# Patient Record
Sex: Male | Born: 1965 | Race: Black or African American | Hispanic: No | Marital: Single | Smoking: Never smoker
Health system: Southern US, Community
[De-identification: ages and names within clinical notes are randomized; demographics above are authoritative.]

## PROBLEM LIST (undated history)

## (undated) DIAGNOSIS — G5600 Carpal tunnel syndrome, unspecified upper limb: Secondary | ICD-10-CM

## (undated) DIAGNOSIS — I00 Rheumatic fever without heart involvement: Secondary | ICD-10-CM

## (undated) HISTORY — PX: EYE SURGERY: SHX253

## (undated) HISTORY — PX: CARPAL TUNNEL RELEASE: SHX101

## (undated) HISTORY — DX: Carpal tunnel syndrome, unspecified upper limb: G56.00

---

## 2010-02-12 ENCOUNTER — Emergency Department (HOSPITAL_BASED_OUTPATIENT_CLINIC_OR_DEPARTMENT_OTHER): Admission: EM | Admit: 2010-02-12 | Discharge: 2010-02-13 | Payer: Self-pay | Admitting: Emergency Medicine

## 2010-06-09 ENCOUNTER — Emergency Department (HOSPITAL_BASED_OUTPATIENT_CLINIC_OR_DEPARTMENT_OTHER): Admission: EM | Admit: 2010-06-09 | Discharge: 2010-06-09 | Payer: Self-pay | Admitting: Emergency Medicine

## 2010-06-09 ENCOUNTER — Ambulatory Visit: Payer: Self-pay | Admitting: Diagnostic Radiology

## 2010-06-30 ENCOUNTER — Emergency Department (HOSPITAL_BASED_OUTPATIENT_CLINIC_OR_DEPARTMENT_OTHER): Admission: EM | Admit: 2010-06-30 | Discharge: 2010-07-01 | Payer: Self-pay | Admitting: Emergency Medicine

## 2010-07-01 ENCOUNTER — Ambulatory Visit: Payer: Self-pay | Admitting: Diagnostic Radiology

## 2010-09-20 ENCOUNTER — Ambulatory Visit: Payer: Self-pay | Admitting: Diagnostic Radiology

## 2010-09-20 ENCOUNTER — Emergency Department (HOSPITAL_BASED_OUTPATIENT_CLINIC_OR_DEPARTMENT_OTHER)
Admission: EM | Admit: 2010-09-20 | Discharge: 2010-09-20 | Payer: Self-pay | Source: Home / Self Care | Admitting: Emergency Medicine

## 2011-02-15 LAB — GLUCOSE, CAPILLARY: Glucose-Capillary: 96 mg/dL (ref 70–99)

## 2011-02-27 LAB — HEMOCCULT GUIAC POC 1CARD (OFFICE): Fecal Occult Bld: POSITIVE
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
## 2014-08-30 ENCOUNTER — Emergency Department (HOSPITAL_BASED_OUTPATIENT_CLINIC_OR_DEPARTMENT_OTHER)
Admission: EM | Admit: 2014-08-30 | Discharge: 2014-08-30 | Disposition: A | Discharge status: Home / Self Care | Payer: BC Managed Care – PPO | Attending: Emergency Medicine | Admitting: Emergency Medicine

## 2014-08-30 ENCOUNTER — Emergency Department (HOSPITAL_BASED_OUTPATIENT_CLINIC_OR_DEPARTMENT_OTHER): Payer: BC Managed Care – PPO

## 2014-08-30 ENCOUNTER — Encounter (HOSPITAL_BASED_OUTPATIENT_CLINIC_OR_DEPARTMENT_OTHER): Payer: Self-pay | Admitting: Emergency Medicine

## 2014-08-30 DIAGNOSIS — M545 Low back pain, unspecified: Secondary | ICD-10-CM

## 2014-08-30 DIAGNOSIS — Y9389 Activity, other specified: Secondary | ICD-10-CM | POA: Insufficient documentation

## 2014-08-30 DIAGNOSIS — Y92410 Unspecified street and highway as the place of occurrence of the external cause: Secondary | ICD-10-CM | POA: Diagnosis not present

## 2014-08-30 DIAGNOSIS — IMO0002 Reserved for concepts with insufficient information to code with codable children: Secondary | ICD-10-CM | POA: Insufficient documentation

## 2014-08-30 MED ORDER — HYDROCODONE-ACETAMINOPHEN 5-325 MG PO TABS: 2.0000 | ORAL_TABLET | ORAL | Status: DC | PRN

## 2014-08-30 MED ORDER — CYCLOBENZAPRINE HCL 10 MG PO TABS: 10.0000 mg | ORAL_TABLET | Freq: Two times a day (BID) | ORAL | Status: DC | PRN

## 2014-08-30 NOTE — Discharge Instructions (Signed)
Take Vicodin and Flexeril as needed for pain. You may take these medications together. Refer to attached documents for more information.

## 2014-08-30 NOTE — ED Provider Notes (Signed)
Medical screening examination/treatment/procedure(s) were performed by non-physician practitioner and as supervising physician I was immediately available for consultation/collaboration.   EKG Interpretation None        Shadaya Marschner, MD 08/30/14 1543 
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
## 2016-02-14 ENCOUNTER — Emergency Department (HOSPITAL_BASED_OUTPATIENT_CLINIC_OR_DEPARTMENT_OTHER): Payer: No Typology Code available for payment source

## 2016-02-14 ENCOUNTER — Emergency Department (HOSPITAL_BASED_OUTPATIENT_CLINIC_OR_DEPARTMENT_OTHER)
Admission: EM | Admit: 2016-02-14 | Discharge: 2016-02-14 | Disposition: A | Discharge status: Home / Self Care | Payer: No Typology Code available for payment source | Attending: Emergency Medicine | Admitting: Emergency Medicine

## 2016-02-14 ENCOUNTER — Encounter (HOSPITAL_BASED_OUTPATIENT_CLINIC_OR_DEPARTMENT_OTHER): Payer: Self-pay | Admitting: *Deleted

## 2016-02-14 DIAGNOSIS — M79604 Pain in right leg: Secondary | ICD-10-CM | POA: Insufficient documentation

## 2016-02-14 DIAGNOSIS — M79605 Pain in left leg: Secondary | ICD-10-CM | POA: Insufficient documentation

## 2016-02-14 DIAGNOSIS — R252 Cramp and spasm: Secondary | ICD-10-CM | POA: Insufficient documentation

## 2016-02-14 DIAGNOSIS — M545 Low back pain: Secondary | ICD-10-CM | POA: Insufficient documentation

## 2016-02-14 DIAGNOSIS — M79672 Pain in left foot: Secondary | ICD-10-CM | POA: Insufficient documentation

## 2016-02-14 LAB — CBC WITH DIFFERENTIAL/PLATELET
BASOS ABS: 0 10*3/uL (ref 0.0–0.1)
BASOS PCT: 0 %
EOS PCT: 2 %
Eosinophils Absolute: 0.1 10*3/uL (ref 0.0–0.7)
HEMATOCRIT: 38.2 % — AB (ref 39.0–52.0)
Hemoglobin: 13.2 g/dL (ref 13.0–17.0)
Lymphocytes Relative: 34 %
Lymphs Abs: 2.1 10*3/uL (ref 0.7–4.0)
MCH: 28.4 pg (ref 26.0–34.0)
MCHC: 34.6 g/dL (ref 30.0–36.0)
MCV: 82.3 fL (ref 78.0–100.0)
MONO ABS: 0.7 10*3/uL (ref 0.1–1.0)
MONOS PCT: 11 %
NEUTROS ABS: 3.3 10*3/uL (ref 1.7–7.7)
Neutrophils Relative %: 53 %
PLATELETS: 232 10*3/uL (ref 150–400)
RBC: 4.64 MIL/uL (ref 4.22–5.81)
RDW: 12.1 % (ref 11.5–15.5)
WBC: 6.1 10*3/uL (ref 4.0–10.5)

## 2016-02-14 LAB — BASIC METABOLIC PANEL
ANION GAP: 6 (ref 5–15)
BUN: 18 mg/dL (ref 6–20)
CALCIUM: 9 mg/dL (ref 8.9–10.3)
CO2: 25 mmol/L (ref 22–32)
CREATININE: 0.99 mg/dL (ref 0.61–1.24)
Chloride: 111 mmol/L (ref 101–111)
GFR calc Af Amer: >60 mL/min (ref >60)
GLUCOSE: 99 mg/dL (ref 65–99)
Potassium: 4.3 mmol/L (ref 3.5–5.1)
Sodium: 142 mmol/L (ref 135–145)

## 2016-02-14 MED ORDER — PREDNISONE 10 MG PO TABS: 20.0000 mg | ORAL_TABLET | Freq: Two times a day (BID) | ORAL | Status: DC

## 2016-02-14 MED ORDER — HYDROCODONE-ACETAMINOPHEN 5-325 MG PO TABS: 1.0000 | ORAL_TABLET | Freq: Four times a day (QID) | ORAL | Status: DC | PRN

## 2016-02-14 NOTE — ED Notes (Signed)
Reports 4-5 months of bilateral leg pain. Family history of CMT

## 2016-02-14 NOTE — ED Provider Notes (Signed)
CSN: [id number]     Arrival date & time 02/14/16  4098 History   First MD Initiated Contact with Patient 02/14/16 1013     Chief Complaint  Patient presents with  . Leg Pain     (Consider location/radiation/quality/duration/timing/severity/associated sxs/prior Treatment) HPI Comments: Patient is a 50 year old male with no significant past medical history. He presents for evaluation of bilateral leg pain. He reports pain in his left heel in the absence of any injury or trauma. He also reports having cramping in his right thigh that occurs at night. This is been ongoing for many months. He also reports that when he wakes in the morning his having difficulty with his legs holding him up. This seems to work it way out throughout the day. He reports having not missed a day of work due to this. He is concerned because his family has a history of Charcot-Marie-Tooth disease.  Patient is a 50 y.o. male presenting with leg pain. The history is provided by the patient.  Leg Pain Location:  Leg Leg location:  R leg and L leg Pain details:    Quality:  Cramping   Radiates to:  Does not radiate   Severity:  Moderate   Onset quality:  Gradual   Duration:  5 months   Timing:  Constant   Progression:  Worsening Chronicity:  New   History reviewed. No pertinent past medical history. Past Surgical History  Procedure Laterality Date  . Eye surgery     No family history on file. Social History  Substance Use Topics  . Smoking status: Never Smoker   . Smokeless tobacco: None  . Alcohol Use: Yes     Comment: occasional    Review of Systems  All other systems reviewed and are negative.     Allergies  Review of patient's allergies indicates no known allergies.  Home Medications   Prior to Admission medications   Medication Sig Start Date End Date Taking? Authorizing Provider  cyclobenzaprine (FLEXERIL) 10 MG tablet Take 1 tablet (10 mg total) by mouth 2 (two) times daily as needed for  muscle spasms. 08/30/14   Emilia Beck, PA-C  HYDROcodone-acetaminophen (NORCO/VICODIN) 5-325 MG per tablet Take 2 tablets by mouth every 4 (four) hours as needed for moderate pain or severe pain. 08/30/14   Kaitlyn Szekalski, PA-C   BP 129/79 mmHg  Pulse 64  Temp(Src) 97.7 F (36.5 C) (Oral)  Resp 18  Ht  (1.753 m)  Wt 190 lb (86.183 kg)  BMI 28.05 kg/m2  SpO2 99% Physical Exam  Constitutional: He is oriented to person, place, and time. He appears well-developed and well-nourished. No distress.  HENT:  Head: Normocephalic and atraumatic.  Neck: Normal range of motion. Neck supple.  Cardiovascular: Normal rate, regular rhythm and normal heart sounds.   No murmur heard. Pulmonary/Chest: Effort normal and breath sounds normal. No respiratory distress.  Musculoskeletal:  There is mild tenderness in the soft tissues of the lumbar region. The right thigh appears grossly normal. The left heel appears grossly normal, however there is tenderness over the bottom of the foot when palpated.  Neurological: He is alert and oriented to person, place, and time.  DTRs are 1+ and symmetrical in the patellar Achilles tendons. Strength is 5 out of 5 in the bilateral lower extremities.  Skin: Skin is warm and dry. He is not diaphoretic.  Nursing note and vitals reviewed.   ED Course  Procedures (including critical care time) Labs Review Labs Reviewed -  No data to display  Imaging Review No results found. I have personally reviewed and evaluated these images and lab results as part of my medical decision-making.    MDM   Final diagnoses:  None    X-rays are negative and blood work is unremarkable. I am uncertain as to what is causing his ongoing leg pain, however nothing today appears emergent. His reflexes and strength are symmetrical. 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 This is a possibility, however he was educated that this  is not an emergency department diagnosis and that he needs to follow-up with his primary Dr. for possible referral to a neurologist.    Geoffery Lyonsouglas Tiasia Weberg, MD 02/15/16 [phone]

## 2016-02-14 NOTE — ED Notes (Signed)
Pt c/o rt thigh pain and left ankle pain x 6 months off and on  States pain is worse in am and when walking,  States has been seen and treated for left ankle

## 2016-02-14 NOTE — Discharge Instructions (Signed)
Prednisone as prescribed.  Hydrocodone as prescribed as needed for pain.  You should follow-up with a neurologist in the near future to discuss your issues.   Musculoskeletal Pain Musculoskeletal pain is muscle and boney aches and pains. These pains can occur in any part of the body. Your caregiver may treat you without knowing the cause of the pain. They may treat you if blood or urine tests, X-rays, and other tests were normal.  CAUSES There is often not a definite cause or reason for these pains. These pains may be caused by a type of germ (virus). The discomfort may also come from overuse. Overuse includes working out too hard when your body is not fit. Boney aches also come from weather changes. Bone is sensitive to atmospheric pressure changes. HOME CARE INSTRUCTIONS   Ask when your test results will be ready. Make sure you get your test results.  Only take over-the-counter or prescription medicines for pain, discomfort, or fever as directed by your caregiver. If you were given medications for your condition, do not drive, operate machinery or power tools, or sign legal documents for 24 hours. Do not drink alcohol. Do not take sleeping pills or other medications that may interfere with treatment.  Continue all activities unless the activities cause more pain. When the pain lessens, slowly resume normal activities. Gradually increase the intensity and duration of the activities or exercise.  During periods of severe pain, bed rest may be helpful. Lay or sit in any position that is comfortable.  Putting ice on the injured area.  Put ice in a bag.  Place a towel between your skin and the bag.  Leave the ice on for 15 to 20 minutes, 3 to 4 times a day.  Follow up with your caregiver for continued problems and no reason can be found for the pain. If the pain becomes worse or does not go away, it may be necessary to repeat tests or do additional testing. Your caregiver may need to look  further for a possible cause. SEEK IMMEDIATE MEDICAL CARE IF:  You have pain that is getting worse and is not relieved by medications.  You develop chest pain that is associated with shortness or breath, sweating, feeling sick to your stomach (nauseous), or throw up (vomit).  Your pain becomes localized to the abdomen.  You develop any new symptoms that seem different or that concern you. MAKE SURE YOU:   Understand these instructions.  Will watch your condition.  Will get help right away if you are not doing well or get worse.   This information is not intended to replace advice given to you by your health care provider. Make sure you discuss any questions you have with your health care provider.   Document Released: 11/20/2005 Document Revised: 02/12/2012 Document Reviewed: 07/25/2013 Elsevier Interactive Patient Education [employer]2016 Elsevier Inc.

## 2016-02-15 ENCOUNTER — Ambulatory Visit (INDEPENDENT_AMBULATORY_CARE_PROVIDER_SITE_OTHER): Payer: BLUE CROSS/BLUE SHIELD | Admitting: Neurology

## 2016-02-15 ENCOUNTER — Encounter: Payer: Self-pay | Admitting: Neurology

## 2016-02-15 VITALS — BP 118/72 | HR 70 | Resp 18 | Ht 69.0 in | Wt 192.0 lb

## 2016-02-15 DIAGNOSIS — M79605 Pain in left leg: Secondary | ICD-10-CM

## 2016-02-15 DIAGNOSIS — Z820 Family history of epilepsy and other diseases of the nervous system: Secondary | ICD-10-CM | POA: Diagnosis not present

## 2016-02-15 DIAGNOSIS — G609 Hereditary and idiopathic neuropathy, unspecified: Secondary | ICD-10-CM

## 2016-02-15 DIAGNOSIS — M79604 Pain in right leg: Secondary | ICD-10-CM | POA: Diagnosis not present

## 2016-02-15 MED ORDER — GABAPENTIN 100 MG PO CAPS
ORAL_CAPSULE | ORAL | Status: DC
Start: 2016-02-15 — End: 2017-12-06

## 2016-02-15 NOTE — Progress Notes (Signed)
Subjective:    Patient ID: Travis Odom is a 50 y.o. male.  HPI     Travis Foley, MD, PhD [facility] [address], [address] [address] [place], [place] [address]  Travis Odom is a 50 year old right-handed gentleman who is referred by the emergency room for bilateral leg pain. The patient is unaccompanied today. He presented to the emergency room on 02/14/2016 with a several month history of bilateral leg cramps and pain particularly at night. He denied any trauma or injury. I reviewed the emergency room records from 02/14/2016. He had a left foot x-ray and 3 views on 02/14/2016 which was reported as negative. He was given a prescription for prednisone, Norco, and Flexeril yesterday but did not fill the medications. He reports leg pain in the below knee areas for the past 1 year, worse in the past 6 months.  He has a family history of Charcot-Marie-Tooth, affecting his mother and all 4 siblings.  He has no PCP. He gets fatigued, and weaker in the legs. Has had leg cramps in the calves and thighs. 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  His work does involve a lot of physical work. He feels that he is struggling to maintain his level of work. He does not smoke cigarettes or use illicit drugs. He drinks alcohol rarely. He tries to hydrate well. He has been soaking his feet and legs in warm water with Epsom salt and this helps reduce his pain. He describes tingling as well in his feet and this is off and on, some numbness. A year ago he injured his left heel. He also has a remote injury to his left calf from playing football. He referrees high school basketball.  His Past Medical History Is Significant For: No past medical history on file.  His Past Surgical History Is Significant For: Past Surgical History  Procedure Laterality Date  . Eye surgery      His Family History Is Significant For: Family History   Problem Relation Age of Onset  . Charcot-Marie-Tooth disease Mother   . Charcot-Marie-Tooth disease Sister   . Charcot-Marie-Tooth disease Brother   . Charcot-Marie-Tooth disease Sister     His Social History Is Significant For: Social History   Social History  . Marital Status: Single    Spouse Name: N/A  . Number of Children: N/A  . Years of Education: 12   Occupational History  . [employer]    Social History Main Topics  . Smoking status: Never Smoker   . Smokeless tobacco: None  . Alcohol Use: Yes     Comment: occasional  . Drug Use: No  . Sexual Activity: Not Asked   Other Topics Concern  . None   Social History Narrative   Denies caffeine use     His Allergies Are:  No Known Allergies:   His Current Medications Are:  Outpatient Encounter Prescriptions as of 02/15/2016  Medication Sig  . cyclobenzaprine (FLEXERIL) 10 MG tablet Take 1 tablet (10 mg total) by mouth 2 (two) times daily as needed for muscle spasms. (Patient not taking: Reported on 02/15/2016)  . gabapentin (NEURONTIN) 100 MG capsule Take 1 pill nightly at bedtime for 1 week, then 2 pills nightly for 1 week, then 3 pills each night thereafter.  Marland Kitchen HYDROcodone-acetaminophen (NORCO) 5-325 MG tablet Take 1-2 tablets by mouth every 6 (six) hours as needed. (Patient not taking: Reported on 02/15/2016)  .  predniSONE (DELTASONE) 10 MG tablet Take 2 tablets (20 mg total) by mouth 2 (two) times daily. (Patient not taking: Reported on 02/15/2016)   No facility-administered encounter medications on file as of 02/15/2016.  :   Review of Systems:  Out of a complete 14 point review of systems, all are reviewed and negative with the exception of these symptoms as listed below:   Review of Systems  Neurological:       Patient reports that he has pain in both legs and feet. States it is so bad in the morning that he has to crawl out of bed and get in warm water.  Charcot-Marie-Tooth Disease runs in his family (mother  and all siblings have it).     Objective:  Neurologic Exam  Physical Exam Physical Examination:   Filed Vitals:   02/15/16 1440  BP: 118/72  Pulse: 70  Resp: 18   General Examination: The patient is a very pleasant 50 y.o. male in no acute distress. He appears well-developed and well-nourished and marginally groomed. He is mildly anxious appearing.   HEENT: Normocephalic, atraumatic, pupils are equal, round and reactive to light and accommodation. Funduscopic exam is normal with sharp disc margins noted. Extraocular tracking is good without limitation to gaze excursion or nystagmus noted. Normal smooth pursuit is noted. Hearing is grossly intact. Face is symmetric with normal facial animation and normal facial sensation. Speech is clear with no dysarthria noted. There is no hypophonia. There is no lip, neck/head, jaw or voice tremor. Neck is supple with full range of passive and active motion. There are no carotid bruits on auscultation. Oropharynx exam reveals: mild mouth dryness, adequate dental hygiene and moderate airway crowding, due to thicker soft palate and tonsils in place. Mallampati is class II. Tongue protrudes centrally and palate elevates symmetrically.    Chest: Clear to auscultation without wheezing, rhonchi or crackles noted.  Heart: S1+S2+0, regular and normal without murmurs, rubs or gallops noted.   Abdomen: Soft, non-tender and non-distended with normal bowel sounds appreciated on auscultation.  Extremities: There is no pitting edema in the distal lower extremities bilaterally. Pedal pulses are intact.  Skin: Warm and dry without trophic changes noted. There are no varicose veins.  Musculoskeletal: exam reveals no obvious joint deformities, tenderness or joint swelling or erythema, mildly slender in the distal legs.   Neurologically:  Mental status: The patient is awake, alert and oriented in all 4 spheres. Her immediate and remote memory, attention, language  skills and fund of knowledge are appropriate. There is no evidence of aphasia, agnosia, apraxia or anomia. Speech is clear with normal prosody and enunciation. Thought process is linear. Mood is normal and affect is normal.  Cranial nerves II - XII are as described above under HEENT exam. In addition: shoulder shrug is normal with equal shoulder height noted. Motor exam: Normal bulk, strength and tone is noted. He may look a little slender in both distal lower extremities below the calves. Otherwise, no focal atrophy is noted, no fasciculations. He does have a big scar across his left calf from a sports injury years ago. There is no drift, tremor or rebound. Romberg is negative. Reflexes are 2+ In the upper extremities and both knees, absent in both ankles. Babinski: Toes are equivocal. Fine motor skills and coordination: intact with normal finger taps, normal hand movements, normal rapid alternating patting, normal foot taps and normal foot agility.  Cerebellar testing: No dysmetria or intention tremor on finger to nose testing. Heel to  shin is unremarkable bilaterally. There is no truncal or gait ataxia.  Sensory exam: intact to light touch, pinprick, vibration, temperature sense in the upper extremities with some hypersensitivity to pinprick in the lower extremities and mild decrease in temperature sense in thefeet bilaterally.  Gait, station and balance: He stands easily. No veering to one side is noted. No leaning to one side is noted. Posture is age-appropriate and stance is narrow based. Gait shows normal stride length and normal pace. No problems turning are noted. He turns en bloc. Tandem walk is slightly challenging for him.               Assessment and Plan:   In summary, Travis Odom is a very pleasant 50 y.o.-year old male with an underlying overall benign medical history, who presents with a one-year history of distal leg pain, mild weakness reported, tingling reported. On examination, he has  no overt weakness but he does have diminished reflexes in the ankles and some signs of peripheral neuropathy. He had blood work which was CMP and CBC yesterday in the emergency room. Unfortunately, he is afraid of needles and is not ready to have blood work done again today. He has a tendency to faint when having blood drawn. I would like to proceed with further testing, differential diagnosis certainly does include hereditary neuropathy such as CMT, other forms of neuropathy, and other reasons for leg pain including restless leg syndrome, plantar fasciitis, prior injuries etc. I suggested blood work and we will check for CK, B12 level, folate, B6 level, TSH, A1c, vitamin D level, and he can have this done later this week. He may want to bring someone who can pick them up and drop him. In addition, I would like to proceed with EMG and nerve conduction testing to further delineate his problem. Symptomatically, for leg pain and nerve related pain, I suggested a cautious trial of gabapentin at night. Of note, he works from 3:30 AM to 3:30 PM, 6 days a week. He is advised to start gabapentin 100 mg each bedtime which is around 9 PM with gradual titration. If needed, later on, we may request help and transition to a neuromuscular specialist. He has a family history of CMT affecting his mother and 4 other siblings. This is certainly high in the differential diagnosis. I will see him back routinely after these tests are done and we will keep them posted via phone call as well. I answered all his questions today and he was in agreement.  Sincerely,   Travis Foley, MD, PhD

## 2016-02-15 NOTE — Patient Instructions (Addendum)
We will check blood work today and call you with the test results. You can come back anytime this week for blood work as discussed. Since you become faint with blood draws, please make sure some can drive you for your blood test, you can come back here.  We will do an EMG and nerve conduction velocity test, which is an electrical nerve and muscle test, which we will schedule. We will call you with the results. For your leg pain, let's try you on Neurontin (gabapentin) 100 mg strength: Take 1 pill nightly at bedtime for 1 week, then 2 pills nightly for 1 week, then 3 pills each night thereafter. The most common side effects reported are sedation or sleepiness. Rare side effects include balance problems, confusion.   We may request input from a neuromuscular specialist in our group down the road.

## 2016-03-06 ENCOUNTER — Ambulatory Visit (INDEPENDENT_AMBULATORY_CARE_PROVIDER_SITE_OTHER): Payer: BLUE CROSS/BLUE SHIELD | Admitting: Diagnostic Neuroimaging

## 2016-03-06 ENCOUNTER — Ambulatory Visit (INDEPENDENT_AMBULATORY_CARE_PROVIDER_SITE_OTHER): Payer: Self-pay | Admitting: Diagnostic Neuroimaging

## 2016-03-06 DIAGNOSIS — G609 Hereditary and idiopathic neuropathy, unspecified: Secondary | ICD-10-CM

## 2016-03-06 DIAGNOSIS — Z0289 Encounter for other administrative examinations: Secondary | ICD-10-CM

## 2016-03-06 DIAGNOSIS — M79604 Pain in right leg: Secondary | ICD-10-CM

## 2016-03-06 DIAGNOSIS — M79605 Pain in left leg: Principal | ICD-10-CM

## 2016-03-06 DIAGNOSIS — Z820 Family history of epilepsy and other diseases of the nervous system: Secondary | ICD-10-CM

## 2016-03-06 NOTE — Progress Notes (Signed)
Quick Note:  Please call and advise the patient that the recent EMG and nerve conduction velocity test, which is the electrical nerve and muscle test we we performed, was reported as within normal limits. We checked for abnormal electrical discharges in the muscles or nerves and the report suggested normal findings. No further action is required on this test at this time. Please remind patient to keep any upcoming appointments or tests and to call us with any interim questions, concerns, problems or updates. Thanks,  Nabiha Planck, MD, PhD   ______ 

## 2016-03-06 NOTE — Procedures (Signed)
   [facility]  NCS (NERVE CONDUCTION STUDY) WITH EMG (ELECTROMYOGRAPHY) REPORT   STUDY DATE: 03/06/16 PATIENT NAME: Travis SacksWilbur Echavarria DOB: 03/31/1966 MRN: [id number]  ORDERING CLINICIAN: Huston FoleySaima Athar, MD PhD   TECHNOLOGIST: Gearldine ShownLorraine Jones  ELECTROMYOGRAPHER: Glenford BayleyVikram R. Yani Coventry, MD  CLINICAL INFORMATION: 50 year old male with foot and leg pain.  FINDINGS: NERVE CONDUCTION STUDY: Bilateral peroneal and tibial motor responses and F wave latencies are normal. Bilateral peroneal sensory responses are normal.   NEEDLE ELECTROMYOGRAPHY: Needle examination of right lower extremity vastus medialis, tibialis anterior, gastrocnemius is normal.   IMPRESSION:  Normal study. No electrodiagnostic evidence of large fiber neuropathy at this time.    INTERPRETING PHYSICIAN:  Suanne MarkerVIKRAM R. Yari Szeliga, MD Certified in Neurology, Neurophysiology and Neuroimaging  [facility] [address], [address] [place], [place] [address] [phone]

## 2016-03-08 ENCOUNTER — Telehealth: Payer: Self-pay

## 2016-03-08 NOTE — Telephone Encounter (Signed)
LM for patient to call back for results. EMG/NCV are within normal limits. Dr. Frances FurbishAthar wants patient to keep upcoming appointment and can talk about results in greater detail.

## 2016-03-08 NOTE — Telephone Encounter (Signed)
-----   Message from Huston FoleySaima Athar, MD sent at 03/06/2016  4:37 PM EDT ----- Please call and advise the patient that the recent EMG and nerve conduction velocity test, which is the electrical nerve and muscle test we we performed, was reported as within normal limits. We checked for abnormal electrical discharges in the muscles or nerves and the report suggested normal findings. No further action is required on this test at this time. Please remind patient to keep any upcoming appointments or tests and to call us with any interim questions, concerns, problems or updates. Thanks,  Huston FoleySaima Athar, MD, PhD

## 2016-05-18 ENCOUNTER — Ambulatory Visit: Payer: BLUE CROSS/BLUE SHIELD | Admitting: Neurology

## 2016-05-18 ENCOUNTER — Telehealth: Payer: Self-pay

## 2016-05-18 NOTE — Telephone Encounter (Signed)
Patient did not show to appt today  

## 2016-05-19 ENCOUNTER — Encounter: Payer: Self-pay | Admitting: Neurology

## 2017-11-04 ENCOUNTER — Encounter (HOSPITAL_BASED_OUTPATIENT_CLINIC_OR_DEPARTMENT_OTHER): Payer: Self-pay | Admitting: Emergency Medicine

## 2017-11-04 ENCOUNTER — Other Ambulatory Visit: Payer: Self-pay

## 2017-11-04 ENCOUNTER — Emergency Department (HOSPITAL_BASED_OUTPATIENT_CLINIC_OR_DEPARTMENT_OTHER)
Admission: EM | Admit: 2017-11-04 | Discharge: 2017-11-04 | Disposition: A | Discharge status: Home / Self Care | Payer: BLUE CROSS/BLUE SHIELD | Attending: Emergency Medicine | Admitting: Emergency Medicine

## 2017-11-04 DIAGNOSIS — G5603 Carpal tunnel syndrome, bilateral upper limbs: Secondary | ICD-10-CM | POA: Diagnosis not present

## 2017-11-04 DIAGNOSIS — M25532 Pain in left wrist: Secondary | ICD-10-CM | POA: Diagnosis not present

## 2017-11-04 DIAGNOSIS — M25531 Pain in right wrist: Secondary | ICD-10-CM | POA: Diagnosis present

## 2017-11-04 MED ORDER — PREDNISONE 20 MG PO TABS: 40.0000 mg | ORAL_TABLET | Freq: Every day | ORAL | 0 refills | Status: DC

## 2017-11-04 NOTE — ED Notes (Signed)
PMS intact before and after. Pt tolerated well. All questions answered. 

## 2017-11-04 NOTE — ED Notes (Signed)
ED Provider at bedside. 

## 2017-11-04 NOTE — ED Provider Notes (Signed)
[facility] EMERGENCY DEPARTMENT Provider Note   CSN: [id number] Arrival date & time: 11/04/17  1744     History   Chief Complaint Chief Complaint  Patient presents with  . Wrist Pain    HPI Travis Odom is a 51 y.o. male.  HPI Patient presents with wrist and forearm pain.  He has had for a while now.  Had been seen a year ago for it and had improved somewhat but his return.  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  Worse at the end of the day and worse at night when he wakes up from the pain.  States he has to raise his arms above his head or sometimes shake them out to help with the pain.  States it hurts in his wrist hands and goes all the way up to his forearms.  Began on the right side now on the left side 2.  No chest pain or trouble breathing.  No numbness or weakness but states he has difficulty closing the hands due to the pain.  The pain of the forearm is on the lateral aspect of the forearm. History reviewed. No pertinent past medical history.  There are no active problems to display for this patient.   Past Surgical History:  Procedure Laterality Date  . EYE SURGERY         Home Medications    Prior to Admission medications   Medication Sig Start Date End Date Taking? Authorizing Provider  cyclobenzaprine (FLEXERIL) 10 MG tablet Take 1 tablet (10 mg total) by mouth 2 (two) times daily as needed for muscle spasms. Patient not taking: Reported on 02/15/2016 08/30/14   Emilia Beck, PA-C  gabapentin (NEURONTIN) 100 MG capsule Take 1 pill nightly at bedtime for 1 week, then 2 pills nightly for 1 week, then 3 pills each night thereafter. 02/15/16   [name], MD  HYDROcodone-acetaminophen (NORCO) 5-325 MG tablet Take 1-2 tablets by mouth every 6 (six) hours as needed. Patient not taking: Reported on 02/15/2016 02/14/16   Geoffery Lyons, MD  predniSONE (DELTASONE) 20 MG tablet Take 2 tablets (40 mg total) by mouth daily. 11/04/17   Benjiman Core, MD    Family  History Family History  Problem Relation Age of Onset  . Charcot-Marie-Tooth disease Mother   . Charcot-Marie-Tooth disease Sister   . Charcot-Marie-Tooth disease Brother   . Charcot-Marie-Tooth disease Sister     Social History Social History   Tobacco Use  . Smoking status: Never Smoker  . Smokeless tobacco: Never Used  Substance Use Topics  . Alcohol use: Yes    Comment: occasional  . Drug use: No     Allergies   Patient has no known allergies.   Review of Systems Review of Systems  Constitutional: Negative for appetite change.  HENT: Negative for congestion.   Respiratory: Negative for shortness of breath.   Cardiovascular: Negative for chest pain.  Gastrointestinal: Negative for abdominal pain.  Genitourinary: Negative for flank pain.  Musculoskeletal: Negative for back pain.       Bilateral hand wrist and forearm pain  Neurological: Negative for weakness.  Psychiatric/Behavioral: Negative for confusion.     Physical Exam Updated Vital Signs BP 107/77 (BP Location: Right Arm)   Pulse (!) 51   Temp 98.3 F (36.8 C) (Oral)   Resp 18   Ht  (1.753 m)   Wt 88.5 kg (195 lb)   SpO2 98%   BMI 28.80 kg/m   Physical Exam  Constitutional: He  appears well-developed.  HENT:  Head: Normocephalic.  Eyes: EOM are normal.  Cardiovascular: Normal rate.  Pulmonary/Chest: Effort normal.  Abdominal: There is no tenderness.  Musculoskeletal:  Tenderness over bilateral wrists on the palmar aspect.  Pain is intensified with persistent flexion at the wrist.  There is some tenderness over the lateral forearm and lateral epicondyle at the elbow.  Strong radial pulse.  Sensation grossly intact in the hand but may have some paresthesias of her median distribution.  Good capillary refill.  Some mild difficulty opening and closing of the hands due to the pain.  Skin: Skin is warm. Capillary refill takes less than 2 seconds.     ED Treatments / Results  Labs (all labs  ordered are listed, but only abnormal results are displayed) Labs Reviewed - No data to display  EKG  EKG Interpretation None       Radiology No results found.  Procedures Procedures (including critical care time)  Medications Ordered in ED Medications - No data to display   Initial Impression / Assessment and Plan / ED Course  I have reviewed the triage vital signs and the nursing notes.  Pertinent labs & imaging results that were available during my care of the patient were reviewed by me and considered in my medical decision making (see chart for details).     Patient with likely overuse injury of bilateral hands and forearms.  Potentially carpal tunnel.  Will give wrist splints bilaterally.  Will give short course of steroids.  Will have follow-up with sports medicine.  Final Clinical Impressions(s) / ED Diagnoses   Final diagnoses:  Carpal tunnel syndrome, bilateral    ED Discharge Orders        Ordered    predniSONE (DELTASONE) 20 MG tablet  Daily     11/04/17 2150       Benjiman CorePickering, Fontaine Hehl, MD 11/04/17 2151

## 2017-11-04 NOTE — ED Triage Notes (Addendum)
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 Patient states that the right is now hurting worse

## 2017-11-13 ENCOUNTER — Encounter: Payer: Self-pay | Admitting: Family Medicine

## 2017-11-13 ENCOUNTER — Ambulatory Visit (INDEPENDENT_AMBULATORY_CARE_PROVIDER_SITE_OTHER): Payer: BLUE CROSS/BLUE SHIELD | Admitting: Family Medicine

## 2017-11-13 DIAGNOSIS — G5603 Carpal tunnel syndrome, bilateral upper limbs: Secondary | ICD-10-CM

## 2017-11-13 DIAGNOSIS — M7711 Lateral epicondylitis, right elbow: Secondary | ICD-10-CM

## 2017-11-13 MED ORDER — PREDNISONE 10 MG PO TABS: ORAL_TABLET | ORAL | 0 refills | Status: DC

## 2017-11-13 NOTE — Patient Instructions (Signed)
Get us a copy of your insurance card when you can and we can get you the wrist braces, counterforce brace, consider physical therapy.  You have carpal tunnel syndrome. Wear wrist braces at nighttime and as often as possible during the day Prednisone extended dose pack for 12 days. Day AFTER finishing prednisone you can take ibuprofen 600mg  three times a day with food OR aleve 2 tabs twice a day with food for pain and inflammation. If not improving, will consider nerve conduction studies to assess severity. Follow up with me in 5-6 weeks.  You have lateral epicondylitis of your right elbow. Try to avoid painful activities as much as possible. Ice the area 3-4 times a day for 15 minutes at a time. Counterforce brace as directed can help unload area - wear this regularly if it provides you with relief. Hammer rotation exercise, wrist extension exercise with 1 pound weight - 3 sets of 10 once a day only.   Stretching - hold for 20-30 seconds and repeat 3 times. Consider physical therapy, injection, nitro patches if not improving. Follow up in 5-6 weeks.

## 2017-11-14 ENCOUNTER — Encounter: Payer: Self-pay | Admitting: Family Medicine

## 2017-11-14 DIAGNOSIS — G5603 Carpal tunnel syndrome, bilateral upper limbs: Secondary | ICD-10-CM | POA: Insufficient documentation

## 2017-11-14 DIAGNOSIS — M7711 Lateral epicondylitis, right elbow: Secondary | ICD-10-CM | POA: Insufficient documentation

## 2017-11-14 NOTE — Progress Notes (Signed)
PCP: Patient, No Pcp Per  Subjective:   HPI: Patient is a 51 y.o. male here for right elbow, bilateral hand/wrist pain.  Patient reports he's had problems with his hands/wrists for over a year though worse past 6 months. 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 Reports by end of work day and at nighttime the fingers in both hands go numb. Pain level 10/10 and sharp at worst. Tried tylenol, ibuprofen 800mg . Prednisone dose pack helped a little with numbness only. He has not used wrist brace because doesn't fit under his work gloves and not using at nighttime. Also with lateral right elbow pain that is sharp and up to 10/10 level. No injury or trauma. Worse with elbow and wrist motions. No skin changes.  History reviewed. No pertinent past medical history.  Current Outpatient Medications on File Prior to Visit  Medication Sig Dispense Refill  . cyclobenzaprine (FLEXERIL) 10 MG tablet Take 1 tablet (10 mg total) by mouth 2 (two) times daily as needed for muscle spasms. (Patient not taking: Reported on 02/15/2016) 20 tablet 0  . gabapentin (NEURONTIN) 100 MG capsule Take 1 pill nightly at bedtime for 1 week, then 2 pills nightly for 1 week, then 3 pills each night thereafter. 90 capsule 3  . HYDROcodone-acetaminophen (NORCO) 5-325 MG tablet Take 1-2 tablets by mouth every 6 (six) hours as needed. (Patient not taking: Reported on 02/15/2016) 15 tablet 0   No current facility-administered medications on file prior to visit.     Past Surgical History:  Procedure Laterality Date  . EYE SURGERY      No Known Allergies  Social History   Socioeconomic History  . Marital status: Single    Spouse name: Not on file  . Number of children: Not on file  . Years of education: 2712  . Highest education level: Not on file  Social Needs  . Financial resource strain: Not on file  . Food insecurity - worry: Not on file  . Food insecurity - inability: Not on file  .  Transportation needs - medical: Not on file  . Transportation needs - non-medical: Not on file  Occupational History  . Occupation: [employer]  Tobacco Use  . Smoking status: Never Smoker  . Smokeless tobacco: Never Used  Substance and Sexual Activity  . Alcohol use: Yes    Comment: occasional  . Drug use: No  . Sexual activity: Not on file  Other Topics Concern  . Not on file  Social History Narrative   Denies caffeine use     Family History  Problem Relation Age of Onset  . Charcot-Marie-Tooth disease Mother   . Charcot-Marie-Tooth disease Sister   . Charcot-Marie-Tooth disease Brother   . Charcot-Marie-Tooth disease Sister     BP 124/87   Pulse 66   Ht 5\' 10"  (1.778 m)   Wt 190 lb (86.2 kg)   BMI 27.26 kg/m   Review of Systems: See HPI above.     Objective:  Physical Exam:  Gen: NAD, comfortable in exam room  Right elbow: No gross deformity, swelling, bruising. TTP lateral epicondyle.  No other tenderness. FROM elbow with 5/5 strength, no pain.  Pain on wrist and 3rd digit extension. Collateral ligaments intact. Cap refill < 2 sec  Bilateral hands/wrists: No deformity, swelling, bruising, atrophy. TTP over carpal tunnel and in thenar area.  No other tenderness of wrist, hands, digits. FROM digits, wrist with 5/5 strength. Positive tinels, equivocal phalens (  pain, no numbness). Sensation intact to light touch in digits. NVI distally.   Assessment & Plan:  1. Bilateral carpal tunnel syndrome - wrist braces at nighttime and as often as possible during the day.  Try extended prednisone dose pack.  Consider NCVs, injections if not improving.  2. Right elbow pain - 2/2 lateral epicondylitis.  Shown home exercises to do daily - may be difficult with concurrent carpal tunnel though.  Icing, counterforce brace.  F/u in 5-6 weeks.

## 2017-11-14 NOTE — Assessment & Plan Note (Signed)
wrist braces at nighttime and as often as possible during the day.  Try extended prednisone dose pack.  Consider NCVs, injections if not improving.

## 2017-11-14 NOTE — Assessment & Plan Note (Signed)
Shown home exercises to do daily - may be difficult with concurrent carpal tunnel though.  Icing, counterforce brace.  F/u in 5-6 weeks.

## 2017-12-03 ENCOUNTER — Telehealth: Payer: Self-pay | Admitting: Family Medicine

## 2017-12-03 MED ORDER — DICLOFENAC SODIUM 75 MG PO TBEC: 75.0000 mg | DELAYED_RELEASE_TABLET | Freq: Two times a day (BID) | ORAL | 1 refills | Status: DC

## 2017-12-03 NOTE — Telephone Encounter (Signed)
Patient informed of medication.  Rescheduled follow up for this Thursday 12/05/2017

## 2017-12-03 NOTE — Telephone Encounter (Signed)
If he's not improving with the extended pack of prednisone we should probably see him sooner than his scheduled follow-up on the 15th - can he come in at the end of the week?  I'll send in voltaren - he can take this twice a day with food.

## 2017-12-03 NOTE — Telephone Encounter (Signed)
Patient came into the office requesting pain medication.   States the pain in his right elbow has gradually gotten worse. He has been taking tylenol but states this has not helped. He goes back to work on Thursday, Jan. 3rd and would like medication before then.   Pharmacy: [facility], [place] - [address]

## 2017-12-06 ENCOUNTER — Encounter: Payer: Self-pay | Admitting: Family Medicine

## 2017-12-06 ENCOUNTER — Ambulatory Visit (INDEPENDENT_AMBULATORY_CARE_PROVIDER_SITE_OTHER): Payer: BLUE CROSS/BLUE SHIELD | Admitting: Family Medicine

## 2017-12-06 VITALS — BP 132/88 | HR 71 | Ht 69.0 in | Wt 185.0 lb

## 2017-12-06 DIAGNOSIS — M7711 Lateral epicondylitis, right elbow: Secondary | ICD-10-CM | POA: Diagnosis not present

## 2017-12-06 DIAGNOSIS — M25532 Pain in left wrist: Secondary | ICD-10-CM | POA: Diagnosis not present

## 2017-12-06 DIAGNOSIS — G5603 Carpal tunnel syndrome, bilateral upper limbs: Secondary | ICD-10-CM

## 2017-12-06 DIAGNOSIS — M25531 Pain in right wrist: Secondary | ICD-10-CM

## 2017-12-06 MED ORDER — GABAPENTIN 300 MG PO CAPS: ORAL_CAPSULE | ORAL | 1 refills | Status: DC

## 2017-12-06 NOTE — Patient Instructions (Addendum)
We will go ahead with nerve conduction studies of your arms since you're not improving with conservative measures (prednisone, wrist braces). I would recommend being out of work for 2 weeks while we arrange the test - i'll call you with the results and next steps. Continue the voltaren, wrist braces. I am changing your gabapentin to 300mg  capsules - take at bedtime for 3 days - then increase to twice a day for 3 days then finally take 300mg  three times a day - call me if this makes you too sleepy though. Consider topical capsaicin or biofreeze as well up to 4 times a day. Follow up and further treatment will depend on the test results.

## 2017-12-10 ENCOUNTER — Encounter: Payer: Self-pay | Admitting: Family Medicine

## 2017-12-10 NOTE — Assessment & Plan Note (Signed)
Not improving with extended prednisone dose pack and wrist braces.  Will go ahead with NCV/EMGs.  Increase gabapentin.  Out of work for short period to help rest.  Consider topical medications.

## 2017-12-10 NOTE — Assessment & Plan Note (Signed)
Stop home exercises for now while evaluating for carpal tunnel.

## 2017-12-10 NOTE — Progress Notes (Signed)
PCP: Patient, No Pcp Per  Subjective:   HPI: Patient is a 52 y.o. male here for right elbow, bilateral hand/wrist pain.  12/11: Patient reports he's had problems with his hands/wrists for over a year though worse past 6 months. 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 Reports by end of work day and at nighttime the fingers in both hands go numb. Pain level 10/10 and sharp at worst. Tried tylenol, ibuprofen 800mg . Prednisone dose pack helped a little with numbness only. He has not used wrist brace because doesn't fit under his work gloves and not using at nighttime. Also with lateral right elbow pain that is sharp and up to 10/10 level. No injury or trauma. Worse with elbow and wrist motions. No skin changes.  12/06/17: Patient reports he feels about the same compared to last visit. Pain is 10/10 and sharp in both wrists, right elbow. Difficulty picking up items. Wearing wrist braces and taking voltaren but not helping. No skin changes. Associated numbness in hands.  History reviewed. No pertinent past medical history.  Current Outpatient Medications on File Prior to Visit  Medication Sig Dispense Refill  . cyclobenzaprine (FLEXERIL) 10 MG tablet Take 1 tablet (10 mg total) by mouth 2 (two) times daily as needed for muscle spasms. (Patient not taking: Reported on 02/15/2016) 20 tablet 0  . diclofenac (VOLTAREN) 75 MG EC tablet Take 1 tablet (75 mg total) by mouth 2 (two) times daily. 60 tablet 1  . predniSONE (DELTASONE) 10 MG tablet 6 tabs po days 1-2, 5 tabs po days 3-4, 4 tabs po days 5-6, 3 tabs po days 7-8, 2 tabs po days 9-10, 1 tab po days 11-12 42 tablet 0   No current facility-administered medications on file prior to visit.     Past Surgical History:  Procedure Laterality Date  . EYE SURGERY      No Known Allergies  Social History   Socioeconomic History  . Marital status: Single    Spouse name: Not on file  . Number of children: Not on file   . Years of education: 76  . Highest education level: Not on file  Social Needs  . Financial resource strain: Not on file  . Food insecurity - worry: Not on file  . Food insecurity - inability: Not on file  . Transportation needs - medical: Not on file  . Transportation needs - non-medical: Not on file  Occupational History  . Occupation: [employer]  Tobacco Use  . Smoking status: Never Smoker  . Smokeless tobacco: Never Used  Substance and Sexual Activity  . Alcohol use: Yes    Comment: occasional  . Drug use: No  . Sexual activity: Not on file  Other Topics Concern  . Not on file  Social History Narrative   Denies caffeine use     Family History  Problem Relation Age of Onset  . Charcot-Marie-Tooth disease Mother   . Charcot-Marie-Tooth disease Sister   . Charcot-Marie-Tooth disease Brother   . Charcot-Marie-Tooth disease Sister     BP 132/88   Pulse 71   Ht 5\' 9"  (1.753 m)   Wt 185 lb (83.9 kg)   BMI 27.32 kg/m   Review of Systems: See HPI above.     Objective:  Physical Exam:  Gen: NAD, comfortable in exam room.  Right elbow: No deformity, swelling, bruising. TTP lateral epicondyle.  No other tenderness. FROM with 5/5 strength, no pain.  Pain with wrist  and 3rd digit extension. Collateral ligaments intact.  Bilateral hands/wrists: No deformity, swelling, bruising, atrophy. TTP carpal tunnel and thenar area.  No other tenderness wrist, hands, digits. FROM digits with 5/5 strength. Positive tinels. Sensation intact to light touch digits. Cap refill < 3 sec.   Assessment & Plan:  1. Bilateral carpal tunnel syndrome - Not improving with extended prednisone dose pack and wrist braces.  Will go ahead with NCV/EMGs.  Increase gabapentin.  Out of work for short period to help rest.  Consider topical medications.    2. Right elbow pain - 2/2 lateral epicondylitis.  Stop home exercises for now while evaluating for carpal tunnel.

## 2017-12-17 ENCOUNTER — Telehealth: Payer: Self-pay | Admitting: Family Medicine

## 2017-12-17 NOTE — Telephone Encounter (Signed)
Patient was excused for 2 weeks from work which ends Thursday the 17th. Patient wants to know if this note can be extended.  Patient has not seen neurologist for testing. They are still reviewing his office notes.

## 2017-12-17 NOTE — Telephone Encounter (Signed)
Patient was denied his worker's compensation claim. He requested to speak to provider regarding office notes. States he was denied due to "lack of occupational disease"

## 2017-12-18 ENCOUNTER — Ambulatory Visit: Payer: Self-pay | Admitting: Family Medicine

## 2017-12-18 NOTE — Telephone Encounter (Signed)
I plan to extend his out of work note ideally through when he has the NCV/EMGs and we can formulate a plan based on those.

## 2017-12-18 NOTE — Telephone Encounter (Signed)
[facility] and was told that Dr. Terrace ArabiaYan is still reviewing the notes.

## 2017-12-18 NOTE — Telephone Encounter (Signed)
Gunnar Fusiaula- can you check on this?  The notes for his NCV/EMGs state they were giving it to Dr. Terrace ArabiaYan to review on 1/9 and no updates since on if scheduled or when she'll be able to review.  I know they're busy.  Thanks!

## 2017-12-18 NOTE — Telephone Encounter (Signed)
Called and left him a message to let me know a good time/number to call him

## 2017-12-19 NOTE — Telephone Encounter (Signed)
Spoke with patient.  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  He states he heard from neurology and is scheduled to have NCV/EMGs tomorrow morning.

## 2017-12-20 ENCOUNTER — Ambulatory Visit (INDEPENDENT_AMBULATORY_CARE_PROVIDER_SITE_OTHER): Payer: BLUE CROSS/BLUE SHIELD | Admitting: Neurology

## 2017-12-20 DIAGNOSIS — G5603 Carpal tunnel syndrome, bilateral upper limbs: Secondary | ICD-10-CM

## 2017-12-20 DIAGNOSIS — Z0289 Encounter for other administrative examinations: Secondary | ICD-10-CM

## 2017-12-20 DIAGNOSIS — G5600 Carpal tunnel syndrome, unspecified upper limb: Secondary | ICD-10-CM

## 2017-12-20 NOTE — Progress Notes (Signed)
Full Name: Travis Odom Gender: Male MRN #: [id number] Date of Birth: 04-16-2066    Visit Date: 12/20/17 09:10 Age: 52 Years 10 Months Old Examining Physician: [name], MD  Referring Physician: [name], [name], MD    History: wrist pain  Summary:   The right median motor nerve showed delayed distal onset latency (5.7 ms, N<4.4). The right orthodromic median sensory nerve showed delayed distal peak latency (4.6 ms, N<3.4) and reduced amplitude (8 V, N>10) The left orthodromic median sensory nerve showed delayed distal peak latency (3.5 ms, N<3.4)  The right median/ulnar (palm) comparison nerve showed prolonged distal peak latency (Median Palm, 3.5 ms, N<2.2) and abnormal peak latency difference (Median Palm-Ulnar Palm, 1.4 ms, N<0.4) with a relative median delay. The left median/ulnar (palm) comparison nerve showed prolonged distal peak latency (Median Palm, 2.5 ms, N<2.2) and normal peak latency difference.  Conclusion: There is electrophysiologic evidence for right moderately-severe Carpal Tunnel Syndrome and Left mild Carpal Tunnel Syndrome.  Cc: [name], [name], MD  [name] M.D.  [facility] [address] [place], [place] [address] Tel: [phone] Fax: [phone]        [facility]    Nerve / Sites Muscle Latency Ref. Amplitude Ref. Rel Amp Segments Distance Velocity Ref. Area    ms ms mV mV %  cm m/s m/s mVms  L Median - APB     Wrist APB 4.3 ?4.4 8.0 ?4.0 100 Wrist - APB 7   34.7     Upper arm APB 8.9  7.1  88.6 Upper arm - Wrist 23 50 ?49 29.5  R Median - APB     Wrist APB 5.7 ?4.4 8.4 ?4.0 100 Wrist - APB 7   42.9     Upper arm APB 10.6  7.8  92.9 Upper arm - Wrist 24 49 ?49 40.7  L Ulnar - ADM     Wrist ADM 3.1 ?3.3 9.6 ?6.0 100 Wrist - ADM 7   30.7     B.Elbow ADM 7.0  8.2  85.5 B.Elbow - Wrist 20 51 ?49 29.0     A.Elbow ADM 8.8  7.0  84.9 A.Elbow - B.Elbow 10 56 ?49 25.9         A.Elbow - Wrist      R Ulnar - ADM     Wrist ADM  2.9 ?3.3 10.5 ?6.0 100 Wrist - ADM 7   38.5     B.Elbow ADM 7.0  9.8  93.4 B.Elbow - Wrist 22 53 ?49 35.9     A.Elbow ADM 8.8  10.1  103 A.Elbow - B.Elbow 10 56 ?49 34.6         A.Elbow - Wrist                 SNC    Nerve / Sites Rec. Site Peak Lat Ref.  Amp Ref. Segments Distance Peak Diff Ref.    ms ms V V  cm ms ms  L Median, Ulnar - Transcarpal comparison     Median Palm Wrist 2.5 ?2.2 32 ?35 Median Palm - Wrist 8       Ulnar Palm Wrist 2.2 ?2.2 15 ?12 Ulnar Palm - Wrist 8          Median Palm - Ulnar Palm  0.3 ?0.4  R Median, Ulnar - Transcarpal comparison     Median Palm Wrist 3.5 ?2.2 30 ?35 Median Palm - Wrist 8       Ulnar  Palm Wrist 2.2 ?2.2 12 ?12 Ulnar Palm - Wrist 8          Median Palm - Ulnar Palm  1.4 ?0.4  L Median - Orthodromic (Dig II, Mid palm)     Dig II Wrist 3.5 ?3.4 10 ?10 Dig II - Wrist 13    R Median - Orthodromic (Dig II, Mid palm)     Dig II Wrist 4.6 ?3.4 8 ?10 Dig II - Wrist 13    L Ulnar - Orthodromic, (Dig V, Mid palm)     Dig V Wrist 3.0 ?3.1 8 ?5 Dig V - Wrist 11    R Ulnar - Orthodromic, (Dig V, Mid palm)     Dig V Wrist 3.0 ?3.1 7 ?5 Dig V - Wrist 64                   F  Wave    Nerve F Lat Ref.   ms ms  L Ulnar - ADM 30.9 ?32.0  R Ulnar - ADM 31.4 ?32.0         EMG full       EMG Summary Table    Spontaneous MUAP Recruitment  Muscle IA Fib PSW Fasc Other Amp Dur. Poly Pattern  R. Deltoid Normal None None None _______ Normal Normal Normal Normal  R. Triceps brachii Normal None None None _______ Normal Normal Normal Normal  R. Opponens pollicis Normal None None None _______ Normal Normal Normal Normal  R. Flexor digitorum profundus (Ulnar) Normal None None None _______ Normal Normal Normal Normal  R. First dorsal interosseous Normal None None None _______ Normal Normal Normal Normal  R. Cervical paraspinals (low) Normal None None None _______ Normal Normal Normal Normal

## 2017-12-23 NOTE — Procedures (Signed)
Full Name: Travis SacksWilbur Bunte Gender: Male MRN #: [id number] Date of Birth: 04-16-2066    Visit Date: 12/20/17 09:10 Age: 52 Years 10 Months Old Examining Physician: Naomie DeanAntonia Nigel Ericsson, MD  Referring Physician: [name], [name], MD    History: wrist pain  Summary:   The right median motor nerve showed delayed distal onset latency (5.7 ms, N<4.4). The right orthodromic median sensory nerve showed delayed distal peak latency (4.6 ms, N<3.4) and reduced amplitude (8 V, N>10) The left orthodromic median sensory nerve showed delayed distal peak latency (3.5 ms, N<3.4)  The right median/ulnar (palm) comparison nerve showed prolonged distal peak latency (Median Palm, 3.5 ms, N<2.2) and abnormal peak latency difference (Median Palm-Ulnar Palm, 1.4 ms, N<0.4) with a relative median delay. The left median/ulnar (palm) comparison nerve showed prolonged distal peak latency (Median Palm, 2.5 ms, N<2.2) and normal peak latency difference.  Conclusion: There is electrophysiologic evidence for right moderately-severe Carpal Tunnel Syndrome and Left mild Carpal Tunnel Syndrome.  Cc: [name], [name], MD  Naomie DeanAntonia Loana Salvaggio M.D.  [facility] [address] [place], [place] [address] Tel: [phone] Fax: [phone]        [facility]    Nerve / Sites Muscle Latency Ref. Amplitude Ref. Rel Amp Segments Distance Velocity Ref. Area    ms ms mV mV %  cm m/s m/s mVms  L Median - APB     Wrist APB 4.3 ?4.4 8.0 ?4.0 100 Wrist - APB 7   34.7     Upper arm APB 8.9  7.1  88.6 Upper arm - Wrist 23 50 ?49 29.5  R Median - APB     Wrist APB 5.7 ?4.4 8.4 ?4.0 100 Wrist - APB 7   42.9     Upper arm APB 10.6  7.8  92.9 Upper arm - Wrist 24 49 ?49 40.7  L Ulnar - ADM     Wrist ADM 3.1 ?3.3 9.6 ?6.0 100 Wrist - ADM 7   30.7     B.Elbow ADM 7.0  8.2  85.5 B.Elbow - Wrist 20 51 ?49 29.0     A.Elbow ADM 8.8  7.0  84.9 A.Elbow - B.Elbow 10 56 ?49 25.9         A.Elbow - Wrist      R Ulnar - ADM     Wrist ADM  2.9 ?3.3 10.5 ?6.0 100 Wrist - ADM 7   38.5     B.Elbow ADM 7.0  9.8  93.4 B.Elbow - Wrist 22 53 ?49 35.9     A.Elbow ADM 8.8  10.1  103 A.Elbow - B.Elbow 10 56 ?49 34.6         A.Elbow - Wrist                 SNC    Nerve / Sites Rec. Site Peak Lat Ref.  Amp Ref. Segments Distance Peak Diff Ref.    ms ms V V  cm ms ms  L Median, Ulnar - Transcarpal comparison     Median Palm Wrist 2.5 ?2.2 32 ?35 Median Palm - Wrist 8       Ulnar Palm Wrist 2.2 ?2.2 15 ?12 Ulnar Palm - Wrist 8          Median Palm - Ulnar Palm  0.3 ?0.4  R Median, Ulnar - Transcarpal comparison     Median Palm Wrist 3.5 ?2.2 30 ?35 Median Palm - Wrist 8       Ulnar  Palm Wrist 2.2 ?2.2 12 ?12 Ulnar Palm - Wrist 8          Median Palm - Ulnar Palm  1.4 ?0.4  L Median - Orthodromic (Dig II, Mid palm)     Dig II Wrist 3.5 ?3.4 10 ?10 Dig II - Wrist 13    R Median - Orthodromic (Dig II, Mid palm)     Dig II Wrist 4.6 ?3.4 8 ?10 Dig II - Wrist 13    L Ulnar - Orthodromic, (Dig V, Mid palm)     Dig V Wrist 3.0 ?3.1 8 ?5 Dig V - Wrist 11    R Ulnar - Orthodromic, (Dig V, Mid palm)     Dig V Wrist 3.0 ?3.1 7 ?5 Dig V - Wrist 64                   F  Wave    Nerve F Lat Ref.   ms ms  L Ulnar - ADM 30.9 ?32.0  R Ulnar - ADM 31.4 ?32.0         EMG full       EMG Summary Table    Spontaneous MUAP Recruitment  Muscle IA Fib PSW Fasc Other Amp Dur. Poly Pattern  R. Deltoid Normal None None None _______ Normal Normal Normal Normal  R. Triceps brachii Normal None None None _______ Normal Normal Normal Normal  R. Opponens pollicis Normal None None None _______ Normal Normal Normal Normal  R. Flexor digitorum profundus (Ulnar) Normal None None None _______ Normal Normal Normal Normal  R. First dorsal interosseous Normal None None None _______ Normal Normal Normal Normal  R. Cervical paraspinals (low) Normal None None None _______ Normal Normal Normal Normal

## 2017-12-23 NOTE — Progress Notes (Signed)
See procedure note.

## 2017-12-26 ENCOUNTER — Telehealth: Payer: Self-pay | Admitting: Family Medicine

## 2017-12-26 NOTE — Telephone Encounter (Signed)
I reviewed the results and spoke with him.  Concerned about severity of his carpal tunnel on the right side.  Please refer him to hand surgeon to discuss carpal tunnel release, let me know date of appointment so I can extend his work note.  He was advised to take voltaren, gabapentin, tylenol in meantime, use braces.

## 2017-12-26 NOTE — Telephone Encounter (Signed)
Patient calling to follow up with provider about Neurology appointment and to inquire about work status

## 2017-12-26 NOTE — Addendum Note (Signed)
Addended by: Kathi SimpersWISE, Marsi Turvey F on: 12/26/2017 04:24 PM   Modules accepted: Orders

## 2017-12-27 NOTE — Telephone Encounter (Signed)
Patient scheduled with Dr. Merlyn LotKuzma on 12/31/17 at 2pm. Patient was informed of appointment and new work note

## 2018-01-10 ENCOUNTER — Telehealth: Payer: Self-pay | Admitting: Family Medicine

## 2018-01-10 NOTE — Telephone Encounter (Signed)
Letter printed for patient to be out through appointment date.

## 2018-01-10 NOTE — Telephone Encounter (Signed)
Patient called regarding work status. Dr. Merlyn LotKuzma wrote him out of work with no specific date and his employer is requiring a note with more detail.   Patient is scheduled with Dr. Orlan Leavensrtman on February 26th but is on the wait list for any cancellations

## 2018-01-10 NOTE — Telephone Encounter (Signed)
Patient informed. He will come by the office tomorrow to pick it up.

## 2018-02-03 ENCOUNTER — Other Ambulatory Visit: Payer: Self-pay

## 2018-02-03 ENCOUNTER — Encounter (HOSPITAL_BASED_OUTPATIENT_CLINIC_OR_DEPARTMENT_OTHER): Payer: Self-pay | Admitting: Adult Health

## 2018-02-03 ENCOUNTER — Emergency Department (HOSPITAL_BASED_OUTPATIENT_CLINIC_OR_DEPARTMENT_OTHER): Payer: BLUE CROSS/BLUE SHIELD

## 2018-02-03 ENCOUNTER — Emergency Department (HOSPITAL_BASED_OUTPATIENT_CLINIC_OR_DEPARTMENT_OTHER)
Admission: EM | Admit: 2018-02-03 | Discharge: 2018-02-03 | Disposition: A | Discharge status: Home / Self Care | Payer: BLUE CROSS/BLUE SHIELD | Attending: Emergency Medicine | Admitting: Emergency Medicine

## 2018-02-03 DIAGNOSIS — Z79899 Other long term (current) drug therapy: Secondary | ICD-10-CM | POA: Insufficient documentation

## 2018-02-03 DIAGNOSIS — M25521 Pain in right elbow: Secondary | ICD-10-CM
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
## 2018-04-23 ENCOUNTER — Other Ambulatory Visit: Payer: Self-pay

## 2018-04-23 ENCOUNTER — Emergency Department (HOSPITAL_BASED_OUTPATIENT_CLINIC_OR_DEPARTMENT_OTHER)
Admission: EM | Admit: 2018-04-23 | Discharge: 2018-04-23 | Disposition: A | Discharge status: Home / Self Care | Payer: BLUE CROSS/BLUE SHIELD | Attending: Emergency Medicine | Admitting: Emergency Medicine

## 2018-04-23 ENCOUNTER — Encounter (HOSPITAL_BASED_OUTPATIENT_CLINIC_OR_DEPARTMENT_OTHER): Payer: Self-pay | Admitting: *Deleted

## 2018-04-23 DIAGNOSIS — M79641 Pain in right hand: Secondary | ICD-10-CM | POA: Diagnosis present

## 2018-04-23 DIAGNOSIS — Z79899 Other long term (current) drug therapy: Secondary | ICD-10-CM | POA: Insufficient documentation

## 2018-04-23 MED ORDER — OXYCODONE-ACETAMINOPHEN 5-325 MG PO TABS: 1.0000 | ORAL_TABLET | Freq: Four times a day (QID) | ORAL | 0 refills | Status: DC | PRN

## 2018-04-23 NOTE — ED Provider Notes (Signed)
[facility] EMERGENCY DEPARTMENT Provider Note   CSN: [id number] Arrival date & time: 04/23/18  2024     History   Chief Complaint Chief Complaint  Patient presents with  . Hand Pain    HPI Travis Odom is a 52 y.o. male.  Patient is a 52 year old male with history of carpal tunnel syndrome.  He presents today complaining of severe pain in his right hand.  He had a carpal tunnel release performed by Dr. Orlan Leavens in April.  He states that since having this surgery his pain has worsened.  He presents tonight stating that he is in severe pain and cannot stand it any longer.  He has been taking Vicodin with little relief.  The history is provided by the patient.  Hand Pain  This is a new problem. The current episode started more than 1 week ago. The problem occurs constantly. The problem has been gradually worsening. Nothing aggravates the symptoms. Nothing relieves the symptoms. He has tried nothing for the symptoms.    History reviewed. No pertinent past medical history.  Patient Active Problem List   Diagnosis Date Noted  . Right lateral epicondylitis 11/14/2017  . Bilateral carpal tunnel syndrome 11/14/2017    Past Surgical History:  Procedure Laterality Date  . EYE SURGERY          Home Medications    Prior to Admission medications   Medication Sig Start Date End Date Taking? Authorizing Provider  cyclobenzaprine (FLEXERIL) 10 MG tablet Take 1 tablet (10 mg total) by mouth 2 (two) times daily as needed for muscle spasms. Patient not taking: Reported on 02/15/2016 08/30/14   Emilia Beck, PA-C  diclofenac (VOLTAREN) 75 MG EC tablet Take 1 tablet (75 mg total) by mouth 2 (two) times daily. 12/03/17   Hudnall, Azucena Fallen, MD  gabapentin (NEURONTIN) 300 MG capsule 1 cap po QHS x 3 days, increase to 1 cap po bid for 3 days, then finally 1 cap po tid. 12/06/17   Hudnall, Azucena Fallen, MD  predniSONE (DELTASONE) 10 MG tablet 6 tabs po days 1-2, 5 tabs po days 3-4, 4  tabs po days 5-6, 3 tabs po days 7-8, 2 tabs po days 9-10, 1 tab po days 11-12 11/13/17   Hudnall, Azucena Fallen, MD    Family History Family History  Problem Relation Age of Onset  . Charcot-Marie-Tooth disease Mother   . Charcot-Marie-Tooth disease Sister   . Charcot-Marie-Tooth disease Brother   . Charcot-Marie-Tooth disease Sister     Social History Social History   Tobacco Use  . Smoking status: Never Smoker  . Smokeless tobacco: Never Used  Substance Use Topics  . Alcohol use: Yes    Comment: occasional  . Drug use: No     Allergies   Patient has no known allergies.   Review of Systems Review of Systems  All other systems reviewed and are negative.    Physical Exam Updated Vital Signs BP 130/86 (BP Location: Right Arm)   Pulse (!) 44   Temp 98.5 F (36.9 C) (Oral)   Resp 16   Ht  (1.753 m)   Wt 83.9 kg (185 lb)   SpO2 97%   BMI 27.32 kg/m   Physical Exam  Constitutional: He is oriented to person, place, and time. He appears well-developed and well-nourished. No distress.  HENT:  Head: Normocephalic and atraumatic.  Neck: Normal range of motion. Neck supple.  Musculoskeletal:  The right hand appears grossly normal.  The surgical incision  appears clean with no erythema or breakdown.  Sensation and motor is intact all fingers.  Capillary refill is brisk.  Neurological: He is alert and oriented to person, place, and time.  Skin: Skin is warm and dry. He is not diaphoretic.  Nursing note and vitals reviewed.    ED Treatments / Results  Labs (all labs ordered are listed, but only abnormal results are displayed) Labs Reviewed - No data to display  EKG None  Radiology No results found.  Procedures Procedures (including critical care time)  Medications Ordered in ED Medications - No data to display   Initial Impression / Assessment and Plan / ED Course  I have reviewed the triage vital signs and the nursing notes.  Pertinent labs & imaging  results that were available during my care of the patient were reviewed by me and considered in my medical decision making (see chart for details).  Will prescribe a short course of percocet. He is to call his surgeon in the morning to arrange a follow up appointment.  Final Clinical Impressions(s) / ED Diagnoses   Final diagnoses:  None    ED Discharge Orders    None       Geoffery Lyons, MD 04/23/18 2341

## 2018-04-23 NOTE — Discharge Instructions (Signed)
Percocet as prescribed as needed for pain.  Call your hand surgeon tomorrow to arrange a follow-up appointment.

## 2018-04-23 NOTE — ED Triage Notes (Signed)
Right hand pain. Hx of surgery on his hand in April. Pain is uncontrollable. He told his surgeon he needed a different pain medication stronger than Vicodin. A Rx was never called in to the pharmacy.

## 2018-06-30 ENCOUNTER — Emergency Department (HOSPITAL_BASED_OUTPATIENT_CLINIC_OR_DEPARTMENT_OTHER)
Admission: EM | Admit: 2018-06-30 | Discharge: 2018-06-30 | Disposition: A | Discharge status: Home / Self Care | Payer: BLUE CROSS/BLUE SHIELD | Attending: Emergency Medicine | Admitting: Emergency Medicine

## 2018-06-30 ENCOUNTER — Encounter (HOSPITAL_BASED_OUTPATIENT_CLINIC_OR_DEPARTMENT_OTHER): Payer: Self-pay | Admitting: Emergency Medicine

## 2018-06-30 ENCOUNTER — Other Ambulatory Visit: Payer: Self-pay

## 2018-06-30 DIAGNOSIS — Y929 Unspecified place or not applicable: Secondary | ICD-10-CM | POA: Insufficient documentation

## 2018-06-30 DIAGNOSIS — Y939 Activity, unspecified: Secondary | ICD-10-CM | POA: Insufficient documentation

## 2018-06-30 DIAGNOSIS — T63441A Toxic effect of venom of bees, accidental (unintentional), initial encounter: Secondary | ICD-10-CM | POA: Insufficient documentation

## 2018-06-30 DIAGNOSIS — Z79899 Other long term (current) drug therapy: Secondary | ICD-10-CM | POA: Insufficient documentation

## 2018-06-30 DIAGNOSIS — S86812A Strain of other muscle(s) and tendon(s) at lower leg level, left leg, initial encounter: Secondary | ICD-10-CM | POA: Insufficient documentation

## 2018-06-30 DIAGNOSIS — Y33XXXA Other specified events, undetermined intent, initial encounter: Secondary | ICD-10-CM | POA: Insufficient documentation

## 2018-06-30 DIAGNOSIS — Y998 Other external cause status: Secondary | ICD-10-CM | POA: Insufficient documentation

## 2018-06-30 MED ORDER — MELOXICAM 7.5 MG PO TABS: 7.5000 mg | ORAL_TABLET | Freq: Every day | ORAL | 0 refills | Status: DC | PRN

## 2018-06-30 MED ORDER — TRAMADOL HCL 50 MG PO TABS: 50.0000 mg | ORAL_TABLET | Freq: Four times a day (QID) | ORAL | 0 refills | Status: DC | PRN

## 2018-06-30 NOTE — ED Triage Notes (Signed)
Patient states that he was stung by multiple yellow jackets on Friday - her reports that the bee sting on his left lower leg is burning

## 2018-06-30 NOTE — Discharge Instructions (Addendum)
I suspect you strain your calf musculature trying to get away from the yellow jackets and your pain at this point is not actually from a bite/sting.

## 2018-06-30 NOTE — ED Notes (Signed)
Patient seen and assessed by the EDP - the patient in not noted distress - Assessment no changed from triage

## 2018-07-07 NOTE — ED Provider Notes (Signed)
[facility] EMERGENCY DEPARTMENT Provider Note   CSN: [id number] Arrival date & time: 06/30/18  1939     History   Chief Complaint Chief Complaint  Patient presents with  . Insect Bite    HPI Travis Odom Hevener is a 52 y.o. male.  HPI   52 year old male with left calf pain.  He states that on Friday he was stung by multiple yellow jackets.  All the areas have resolved aside from still having pain in his left calf.  Mild ache at rest.  Sniffily worse with ambulation.  No rash.  No swelling.  No fevers or chills.  No drainage.  History reviewed. No pertinent past medical history.  Patient Active Problem List   Diagnosis Date Noted  . Right lateral epicondylitis 11/14/2017  . Bilateral carpal tunnel syndrome 11/14/2017    Past Surgical History:  Procedure Laterality Date  . EYE SURGERY          Home Medications    Prior to Admission medications   Medication Sig Start Date End Date Taking? Authorizing Provider  cyclobenzaprine (FLEXERIL) 10 MG tablet Take 1 tablet (10 mg total) by mouth 2 (two) times daily as needed for muscle spasms. Patient not taking: Reported on 02/15/2016 08/30/14   Emilia BeckSzekalski, Kaitlyn, PA-C  diclofenac (VOLTAREN) 75 MG EC tablet Take 1 tablet (75 mg total) by mouth 2 (two) times daily. 12/03/17   Hudnall, Azucena FallenShane R, MD  gabapentin (NEURONTIN) 300 MG capsule 1 cap po QHS x 3 days, increase to 1 cap po bid for 3 days, then finally 1 cap po tid. 12/06/17   Hudnall, Azucena FallenShane R, MD  meloxicam (MOBIC) 7.5 MG tablet Take 1 tablet (7.5 mg total) by mouth daily as needed for pain. 06/30/18   Raeford RazorKohut, Fairy Ashlock, MD  oxyCODONE-acetaminophen (PERCOCET/ROXICET) 5-325 MG tablet Take 1-2 tablets by mouth every 6 (six) hours as needed for severe pain. 04/23/18   Geoffery Lyonselo, Douglas, MD  predniSONE (DELTASONE) 10 MG tablet 6 tabs po days 1-2, 5 tabs po days 3-4, 4 tabs po days 5-6, 3 tabs po days 7-8, 2 tabs po days 9-10, 1 tab po days 11-12 11/13/17   Hudnall, Azucena FallenShane R, MD    traMADol (ULTRAM) 50 MG tablet Take 1 tablet (50 mg total) by mouth every 6 (six) hours as needed. 06/30/18   Raeford RazorKohut, Clint Strupp, MD    Family History Family History  Problem Relation Age of Onset  . Charcot-Marie-Tooth disease Mother   . Charcot-Marie-Tooth disease Sister   . Charcot-Marie-Tooth disease Brother   . Charcot-Marie-Tooth disease Sister     Social History Social History   Tobacco Use  . Smoking status: Never Smoker  . Smokeless tobacco: Never Used  Substance Use Topics  . Alcohol use: Yes    Comment: occasional  . Drug use: No     Allergies   Patient has no known allergies.   Review of Systems Review of Systems All systems reviewed and negative, other than as noted in HPI.  Physical Exam Updated Vital Signs BP 120/80 (BP Location: Left Arm)   Pulse 60   Temp 98 F (36.7 C) (Oral)   Resp 18   Ht 5\' 9"  (1.753 m)   Wt 81.6 kg (180 lb)   SpO2 100%   BMI 26.58 kg/m   Physical Exam  Constitutional: He appears well-developed and well-nourished. No distress.  HENT:  Head: Normocephalic and atraumatic.  Eyes: Conjunctivae are normal. Right eye exhibits no discharge. Left eye exhibits no discharge.  Neck:  Neck supple.  Cardiovascular: Normal rate, regular rhythm and normal heart sounds. Exam reveals no gallop and no friction rub.  No murmur heard. Pulmonary/Chest: Effort normal and breath sounds normal. No respiratory distress.  Abdominal: Soft. He exhibits no distension. There is no tenderness.  Musculoskeletal: He exhibits no edema or tenderness.  Points to the left lateral calf is area where he was bitten/stung.  I cannot clearly tell exact location.  No obvious skin lesions.  No indurated areas.  No erythema or cellulitic changes.  No abscess.  Diffuse muscular tenderness of the calf musculature.  Increased pain with dorsiflexion of the foot.  Neurovascular intact distally.  Neurological: He is alert.  Skin: Skin is warm and dry.  Psychiatric: He has a  normal mood and affect. His behavior is normal. Thought content normal.  Nursing note and vitals reviewed.    ED Treatments / Results  Labs (all labs ordered are listed, but only abnormal results are displayed) Labs Reviewed - No data to display  EKG None  Radiology No results found.  Procedures Procedures (including critical care time)  Medications Ordered in ED Medications - No data to display   Initial Impression / Assessment and Plan / ED Course  I have reviewed the triage vital signs and the nursing notes.  Pertinent labs & imaging results that were available during my care of the patient were reviewed by me and considered in my medical decision making (see chart for details).    52 year old male with left calf pain.  I do not think this is from actual bee sting but rather muscular strain when trying to get away from him.  No concerning skin changes.  Tenderness is more diffuse than I would expect.  There is no cellulitis.  Increased pain with plantarflexion of the foot.  Plan symptomatic treatment.  Final Clinical Impressions(s) / ED Diagnoses   Final diagnoses:  Strain of calf muscle, left, initial encounter    ED Discharge Orders        Ordered    meloxicam (MOBIC) 7.5 MG tablet  Daily PRN     06/30/18 1955    traMADol (ULTRAM) 50 MG tablet  Every 6 hours PRN     06/30/18 1955       Raeford Razor, MD 07/07/18 1734

## 2019-06-10 ENCOUNTER — Emergency Department (HOSPITAL_BASED_OUTPATIENT_CLINIC_OR_DEPARTMENT_OTHER): Payer: Self-pay

## 2019-06-10 ENCOUNTER — Encounter (HOSPITAL_BASED_OUTPATIENT_CLINIC_OR_DEPARTMENT_OTHER): Payer: Self-pay

## 2019-06-10 ENCOUNTER — Other Ambulatory Visit: Payer: Self-pay

## 2019-06-10 ENCOUNTER — Emergency Department (HOSPITAL_BASED_OUTPATIENT_CLINIC_OR_DEPARTMENT_OTHER)
Admission: EM | Admit: 2019-06-10 | Discharge: 2019-06-10 | Disposition: A | Discharge status: Home / Self Care | Payer: Self-pay | Attending: Emergency Medicine | Admitting: Emergency Medicine

## 2019-06-10 DIAGNOSIS — Y9379 Activity, other specified sports and athletics: Secondary | ICD-10-CM | POA: Insufficient documentation

## 2019-06-10 DIAGNOSIS — Y9289 Other specified places as the place of occurrence of the external cause: Secondary | ICD-10-CM | POA: Insufficient documentation

## 2019-06-10 DIAGNOSIS — X509XXA Other and unspecified overexertion or strenuous movements or postures, initial encounter: Secondary | ICD-10-CM | POA: Insufficient documentation

## 2019-06-10 DIAGNOSIS — S86812A Strain of other muscle(s) and tendon(s) at lower leg level, left leg, initial encounter: Secondary | ICD-10-CM

## 2019-06-10 DIAGNOSIS — Y999 Unspecified external cause status: Secondary | ICD-10-CM | POA: Insufficient documentation

## 2019-06-10 DIAGNOSIS — S86112A Strain of other muscle(s) and tendon(s) of posterior muscle group at lower leg level, left leg, initial encounter: Secondary | ICD-10-CM | POA: Insufficient documentation

## 2019-06-10 HISTORY — DX: Rheumatic fever without heart involvement: I00

## 2019-06-10 MED ORDER — IBUPROFEN 800 MG PO TABS
800.0000 mg | ORAL_TABLET | Freq: Once | ORAL | Status: AC
  Administered 2019-06-10: 20:00:00 800 mg via ORAL
  Filled 2019-06-10: qty 1

## 2019-06-10 NOTE — Discharge Instructions (Addendum)
Recommend continued use of ice, Tylenol, Motrin, decreased activity until your pain has improved.  Follow-up with your primary care doctor.  You do not have a blood clot.  Suspect muscle strain.

## 2019-06-10 NOTE — ED Triage Notes (Signed)
Pt c/o pain to left LE x 2 days-states started after recent return to physical exercise-NAD-steady gait

## 2019-06-10 NOTE — ED Provider Notes (Signed)
[facility] EMERGENCY DEPARTMENT Provider Note   CSN: [id number] Arrival date & time: 06/10/19  1853    History   Chief Complaint Chief Complaint  Patient presents with  . Leg Pain    HPI Brendia SacksWilbur Scheerer is a 53 y.o. male.     The history is provided by the patient.  Leg Pain Location:  Leg Leg location:  L lower leg Pain details:    Quality:  Cramping   Radiates to:  Does not radiate   Severity:  Mild   Onset quality:  Gradual   Timing:  Intermittent   Progression:  Waxing and waning Chronicity:  Recurrent Relieved by:  Nothing Worsened by:  Nothing Associated symptoms: stiffness   Associated symptoms: no back pain, no fatigue and no fever     Past Medical History:  Diagnosis Date  . Rheumatic fever in pediatric patient     Patient Active Problem List   Diagnosis Date Noted  . Right lateral epicondylitis 11/14/2017  . Bilateral carpal tunnel syndrome 11/14/2017    Past Surgical History:  Procedure Laterality Date  . CARPAL TUNNEL RELEASE    . EYE SURGERY          Home Medications    Prior to Admission medications   Medication Sig Start Date End Date Taking? Authorizing Provider  cyclobenzaprine (FLEXERIL) 10 MG tablet Take 1 tablet (10 mg total) by mouth 2 (two) times daily as needed for muscle spasms. Patient not taking: Reported on 02/15/2016 08/30/14   Emilia BeckSzekalski, Kaitlyn, PA-C  diclofenac (VOLTAREN) 75 MG EC tablet Take 1 tablet (75 mg total) by mouth 2 (two) times daily. 12/03/17   Hudnall, Azucena FallenShane R, MD  gabapentin (NEURONTIN) 300 MG capsule 1 cap po QHS x 3 days, increase to 1 cap po bid for 3 days, then finally 1 cap po tid. 12/06/17   Hudnall, Azucena FallenShane R, MD  meloxicam (MOBIC) 7.5 MG tablet Take 1 tablet (7.5 mg total) by mouth daily as needed for pain. 06/30/18   Raeford RazorKohut, Stephen, MD  oxyCODONE-acetaminophen (PERCOCET/ROXICET) 5-325 MG tablet Take 1-2 tablets by mouth every 6 (six) hours as needed for severe pain. 04/23/18   Geoffery Lyonselo, Douglas, MD   predniSONE (DELTASONE) 10 MG tablet 6 tabs po days 1-2, 5 tabs po days 3-4, 4 tabs po days 5-6, 3 tabs po days 7-8, 2 tabs po days 9-10, 1 tab po days 11-12 11/13/17   Hudnall, Azucena FallenShane R, MD  traMADol (ULTRAM) 50 MG tablet Take 1 tablet (50 mg total) by mouth every 6 (six) hours as needed. 06/30/18   Raeford RazorKohut, Stephen, MD    Family History Family History  Problem Relation Age of Onset  . Charcot-Marie-Tooth disease Mother   . Charcot-Marie-Tooth disease Sister   . Charcot-Marie-Tooth disease Brother   . Charcot-Marie-Tooth disease Sister     Social History Social History   Tobacco Use  . Smoking status: Never Smoker  . Smokeless tobacco: Never Used  Substance Use Topics  . Alcohol use: Yes    Comment: occasional  . Drug use: No     Allergies   Patient has no known allergies.   Review of Systems Review of Systems  Constitutional: Negative for chills, fatigue and fever.  HENT: Negative for ear pain and sore throat.   Eyes: Negative for pain and visual disturbance.  Respiratory: Negative for cough and shortness of breath.   Cardiovascular: Negative for chest pain and palpitations.  Gastrointestinal: Negative for abdominal pain and vomiting.  Genitourinary: Negative for  dysuria and hematuria.  Musculoskeletal: Positive for gait problem and stiffness. Negative for arthralgias and back pain.       Left calf pain and stiffness  Skin: Negative for color change and rash.  Neurological: Negative for seizures and syncope.  All other systems reviewed and are negative.    Physical Exam Updated Vital Signs  ED Triage Vitals  Enc Vitals Group     BP 06/10/19 1911 (!) 143/96     Pulse Rate 06/10/19 1911 63     Resp 06/10/19 1911 16     Temp 06/10/19 1911 98.7 F (37.1 C)     Temp Source 06/10/19 1911 Oral     SpO2 06/10/19 1911 99 %     Weight 06/10/19 1912 206 lb (93.4 kg)     Height 06/10/19 1912 5\' 9"  (1.753 m)     Head Circumference --      Peak Flow --      Pain Score  06/10/19 1909 10     Pain Loc --      Pain Edu? --      Excl. in [place]? --     Physical Exam Vitals signs and nursing note reviewed.  Constitutional:      Appearance: He is well-developed.  HENT:     Head: Normocephalic and atraumatic.  Eyes:     Conjunctiva/sclera: Conjunctivae normal.  Neck:     Musculoskeletal: Neck supple.  Cardiovascular:     Rate and Rhythm: Normal rate and regular rhythm.     Pulses: Normal pulses.     Heart sounds: Normal heart sounds. No murmur.  Pulmonary:     Effort: Pulmonary effort is normal. No respiratory distress.     Breath sounds: Normal breath sounds.  Abdominal:     Palpations: Abdomen is soft.     Tenderness: There is no abdominal tenderness.  Musculoskeletal: Normal range of motion.        General: Tenderness (TTP to left calf) present. No swelling.  Skin:    General: Skin is warm and dry.  Neurological:     Mental Status: He is alert.      ED Treatments / Results  Labs (all labs ordered are listed, but only abnormal results are displayed) Labs Reviewed - No data to display  EKG None  Radiology US Venous Img Lower  Left (dvt Study)  Result Date: 06/10/2019 CLINICAL DATA:  Left leg pain for 2 days EXAM: LEFT LOWER EXTREMITY VENOUS DOPPLER ULTRASOUND TECHNIQUE: Gray-scale sonography with graded compression, as well as color Doppler and duplex ultrasound were performed to evaluate the lower extremity deep venous systems from the level of the common femoral vein and including the common femoral, femoral, profunda femoral, popliteal and calf veins including the posterior tibial, peroneal and gastrocnemius veins when visible. The superficial great saphenous vein was also interrogated. Spectral Doppler was utilized to evaluate flow at rest and with distal augmentation maneuvers in the common femoral, femoral and popliteal veins. COMPARISON:  None. FINDINGS: Contralateral Common Femoral Vein: Respiratory phasicity is normal and symmetric with  the symptomatic side. No evidence of thrombus. Normal compressibility. Common Femoral Vein: No evidence of thrombus. Normal compressibility, respiratory phasicity and response to augmentation. Saphenofemoral Junction: No evidence of thrombus. Normal compressibility and flow on color Doppler imaging. Profunda Femoral Vein: No evidence of thrombus. Normal compressibility and flow on color Doppler imaging. Femoral Vein: No evidence of thrombus. Normal compressibility, respiratory phasicity and response to augmentation. Popliteal Vein: No evidence of thrombus. Normal  compressibility, respiratory phasicity and response to augmentation. Calf Veins: No evidence of thrombus. Normal compressibility and flow on color Doppler imaging. Superficial Great Saphenous Vein: No evidence of thrombus. Normal compressibility. Venous Reflux:  None. Other Findings:  None. IMPRESSION: No evidence of deep venous thrombosis. Electronically Signed   By: Alcide CleverMark  Lukens M.D.   On: 06/10/2019 19:49    Procedures Procedures (including critical care time)  Medications Ordered in ED Medications  ibuprofen (ADVIL) tablet 800 mg (800 mg Oral Given 06/10/19 1954)     Initial Impression / Assessment and Plan / ED Course  I have reviewed the triage vital signs and the nursing notes.  Pertinent labs & imaging results that were available during my care of the patient were reviewed by me and considered in my medical decision making (see chart for details).        Brendia SacksWilbur Spang is a 53 year old male here with left calf pain.  Here to rule out DVT.  DVT study negative.  Patient likely with calf strain.  Has had increased physical activity with sprints and jumping exercises.  Suspect likely calf strain.  Recommend Tylenol, ice, Motrin, stretching.  No bony tenderness on exam.  Patient with good pulses.  Doubt arterial process.  Given information to follow-up with primary care doctor and discharged in ED in good condition.  This chart was  dictated using voice recognition software.  Despite best efforts to proofread,  errors can occur which can change the documentation meaning.    Final Clinical Impressions(s) / ED Diagnoses   Final diagnoses:  Strain of calf muscle, left, initial encounter    ED Discharge Orders    None       Virgina NorfolkCuratolo, Phil Michels, DO 06/10/19 1955

## 2019-08-24 ENCOUNTER — Encounter (HOSPITAL_BASED_OUTPATIENT_CLINIC_OR_DEPARTMENT_OTHER): Payer: Self-pay | Admitting: Emergency Medicine

## 2019-08-24 ENCOUNTER — Emergency Department (HOSPITAL_BASED_OUTPATIENT_CLINIC_OR_DEPARTMENT_OTHER)
Admission: EM | Admit: 2019-08-24 | Discharge: 2019-08-24 | Disposition: A | Discharge status: Home / Self Care | Payer: No Typology Code available for payment source | Attending: Emergency Medicine | Admitting: Emergency Medicine

## 2019-08-24 DIAGNOSIS — Z79899 Other long term (current) drug therapy: Secondary | ICD-10-CM | POA: Diagnosis not present

## 2019-08-24 DIAGNOSIS — Y92410 Unspecified street and highway as the place of occurrence of the external cause: Secondary | ICD-10-CM | POA: Insufficient documentation

## 2019-08-24 DIAGNOSIS — S39012A Strain of muscle, fascia and tendon of lower back, initial encounter: Secondary | ICD-10-CM

## 2019-08-24 DIAGNOSIS — S3992XA Unspecified injury of lower back, initial encounter: Secondary | ICD-10-CM | POA: Diagnosis present

## 2019-08-24 DIAGNOSIS — Y939 Activity, unspecified: Secondary | ICD-10-CM | POA: Diagnosis not present

## 2019-08-24 DIAGNOSIS — Y999 Unspecified external cause status: Secondary | ICD-10-CM | POA: Insufficient documentation

## 2019-08-24 MED ORDER — CYCLOBENZAPRINE HCL 10 MG PO TABS: 10.0000 mg | ORAL_TABLET | Freq: Two times a day (BID) | ORAL | 0 refills | Status: DC | PRN

## 2019-08-24 MED ORDER — METHYLPREDNISOLONE 4 MG PO TBPK: ORAL_TABLET | ORAL | 0 refills | Status: DC

## 2019-08-24 NOTE — Discharge Instructions (Signed)
Continue Motrin 800 mg every 8 hours for the next 5 days.

## 2019-08-24 NOTE — ED Notes (Signed)
ED Provider at bedside. 
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
## 2020-01-05 ENCOUNTER — Encounter: Payer: Self-pay | Admitting: Family Medicine

## 2020-01-05 ENCOUNTER — Ambulatory Visit (INDEPENDENT_AMBULATORY_CARE_PROVIDER_SITE_OTHER): Payer: Self-pay | Admitting: Family Medicine

## 2020-01-05 ENCOUNTER — Other Ambulatory Visit: Payer: Self-pay

## 2020-01-05 DIAGNOSIS — G5603 Carpal tunnel syndrome, bilateral upper limbs: Secondary | ICD-10-CM

## 2020-01-05 MED ORDER — NORTRIPTYLINE HCL 10 MG PO CAPS: 10.0000 mg | ORAL_CAPSULE | Freq: Every day | ORAL | 0 refills | Status: DC

## 2020-01-05 NOTE — Patient Instructions (Signed)
Start nortriptyline 10mg  at bedtime. Call me in a week and let me know how you're doing. If you're doing well we have plenty of room to go up on this medicine to give you more relief. Consider imaging of your neck as well as we discussed. Follow up with me in 1 month otherwise

## 2020-01-05 NOTE — Assessment & Plan Note (Signed)
Patient with bilateral carpal tunnel s/p BL carpal tunnel release surgeries. R worse than L. Previously no improvement with steroid dose packs, gabapentin, NSAIDs or voltaren gel. Patient reports continuing to use braces nightly and exercising regularly. Has not followed up with surgery due to covid and not being able to afford the visit. Limited options given hx. Patient may benefit from further evaluation  of cervical spine given continued symptoms, but he would like to forego imaging of cervical neck at this time due to cost. Will trial nortriptyline starting at 10mg  nightly and will titrate up to see if this helps with sx.

## 2020-01-05 NOTE — Progress Notes (Signed)
   HPI  CC: BL hand pain  Previously diagnosed with carpal tunnel syndrome on both sides since 2018, R>L. Patient tried splinting overnight, steroid injection, prolonged steroid course and NSAIDs (oral and topical) with minimal  relief. Nerve conductions were positive x2. He was referred to Dr. Melvyn Novas, neurology and underwent injections followed by a carpal tunnel releases on his right side 03/27/2018 on his left side 02/02/2019. He reports he was unable to follow up after surgery due to cost and covid. He has been told he needs imaging of his neck but he has not had this done due to cost.   See HPI and/or previous note for associated ROS.  Objective: BP 110/84   Ht 5\' 9"  (1.753 m)   Wt 210 lb (95.3 kg)   BMI 31.01 kg/m  Gen: NAD, well groomed, a/o x3, normal affect.  Cervical: decreased ROM in extension, with full ROM in flexion and rotation. Spurlings negative.  CV: Well-perfused. Warm.  Resp: Non-labored.  Neuro: No gross coordination deficits.  Bilateral hands/wrists: No deformity, swelling, bruising, atrophy. TTP over carpal tunnel and thenar areas.  No other tenderness wrist, hands, digits. FROM digits with 4/5 strengths throughout. Positive tinels. Sensation diminished to light touch 2nd and 3rd digits. Cap refill < 3 sec.  Assessment and plan:  Bilateral carpal tunnel syndrome Patient with bilateral carpal tunnel s/p BL carpal tunnel release surgeries. R worse than L. Previously no improvement with steroid dose packs, gabapentin, NSAIDs or voltaren gel, steroid injections. Patient reports continuing to use braces nightly and exercising regularly. Has not followed up with surgery due to covid and not being able to afford the visit. Limited options given hx. Patient may benefit from further evaluation  of cervical spine given continued symptoms, but he would like to forego imaging of cervical neck at this time due to cost. Will trial nortriptyline starting at 10mg  nightly and will  titrate up to see if this helps with sx.    Novie Maggio, DO PGY-3, Family Medicine  01/05/2020 4:20 PM

## 2020-02-04 ENCOUNTER — Other Ambulatory Visit: Payer: Self-pay

## 2020-02-04 ENCOUNTER — Ambulatory Visit (INDEPENDENT_AMBULATORY_CARE_PROVIDER_SITE_OTHER): Payer: Self-pay | Admitting: Family Medicine

## 2020-02-04 ENCOUNTER — Encounter: Payer: Self-pay | Admitting: Family Medicine

## 2020-02-04 VITALS — BP 124/86 | Ht 69.0 in | Wt 205.0 lb

## 2020-02-04 DIAGNOSIS — G5603 Carpal tunnel syndrome, bilateral upper limbs: Secondary | ICD-10-CM

## 2020-02-04 MED ORDER — NORTRIPTYLINE HCL 25 MG PO CAPS: 75.0000 mg | ORAL_CAPSULE | Freq: Every day | ORAL | 1 refills | Status: AC

## 2020-02-04 NOTE — Progress Notes (Signed)
PCP: [facility]  Subjective:   HPI: Patient is a 54 y.o. male here for bilateral hand/wrist pain.  2/1: Previously diagnosed with carpal tunnel syndrome on both sides since 2018, R>L. Patient tried splinting overnight, steroid injection, prolonged steroid course and NSAIDs (oral and topical) with minimal  relief. Nerve conductions were positive x2. He was referred to Dr. Melvyn Novas, neurology and underwent injections followed by a carpal tunnel releases on his right side 03/27/2018 on his left side 02/02/2019. He reports he was unable to follow up after surgery due to cost and covid. He has been told he needs imaging of his neck but he has not had this done due to cost.   See HPI and/or previous note for associated ROS.  3/3: Patient reports he continues to struggle with pain and numbness in both upper hands and wrists.   Continues to use nighttime splints, has been taking nortriptyline 30mg  qhs without much relief unfortunately. No neck pain. No radiation into upper arms.  Past Medical History:  Diagnosis Date  . Rheumatic fever in pediatric patient     No current outpatient medications on file prior to visit.   No current facility-administered medications on file prior to visit.    Past Surgical History:  Procedure Laterality Date  . CARPAL TUNNEL RELEASE    . EYE SURGERY      No Known Allergies  Social History   Socioeconomic History  . Marital status: Single    Spouse name: Not on file  . Number of children: Not on file  . Years of education: 9  . Highest education level: Not on file  Occupational History  . Occupation: [employer]  Tobacco Use  . Smoking status: Never Smoker  . Smokeless tobacco: Never Used  Substance and Sexual Activity  . Alcohol use: Yes    Comment: occasional  . Drug use: No  . Sexual activity: Not on file  Other Topics Concern  . Not on file  Social History Narrative   Denies caffeine use    Social Determinants of Health    Financial Resource Strain:   . Difficulty of Paying Living Expenses: Not on file  Food Insecurity:   . Worried About 14 in the Last Year: Not on file  . Ran Out of Food in the Last Year: Not on file  Transportation Needs:   . Lack of Transportation (Medical): Not on file  . Lack of Transportation (Non-Medical): Not on file  Physical Activity:   . Days of Exercise per Week: Not on file  . Minutes of Exercise per Session: Not on file  Stress:   . Feeling of Stress : Not on file  Social Connections:   . Frequency of Communication with Friends and Family: Not on file  . Frequency of Social Gatherings with Friends and Family: Not on file  . Attends Religious Services: Not on file  . Active Member of Clubs or Organizations: Not on file  . Attends Programme researcher, broadcasting/film/video Meetings: Not on file  . Marital Status: Not on file  Intimate Partner Violence:   . Fear of Current or Ex-Partner: Not on file  . Emotionally Abused: Not on file  . Physically Abused: Not on file  . Sexually Abused: Not on file    Family History  Problem Relation Age of Onset  . Charcot-Marie-Tooth disease Mother   . Charcot-Marie-Tooth disease Sister   . Charcot-Marie-Tooth disease Brother   . Charcot-Marie-Tooth disease Sister  BP 124/86   Ht 5\' 9"  (1.753 m)   Wt 205 lb (93 kg)   BMI 30.27 kg/m   Review of Systems: See HPI above.     Objective:  Physical Exam:  Gen: NAD, comfortable in exam room  Right hand/wrist: No deformity, swelling, bruising, atrophy. FROM with 5/5 strength except 4/5 with thumb opposition. No tenderness to palpation currently Sensation diminished to light touch radial aspect of hand, thumb. Negative tinels. Positive phalens.  Left hand/wrist: No deformity, swelling, bruising, atrophy. FROM with 5/5 strength except 4/5 with thumb opposition. No tenderness to palpation currently Sensation diminished to light touch radial aspect of hand,  thumb. Negative tinels. Positive phalens.   Assessment & Plan:  1. Bilateral hand/wrist pain and numbness - known history of carpal tunnel syndrome s/p releases.  Unfortunately continues to struggle with pain and numbness.  Will titrate nortriptyline up to 75mg  at bedtime.  Continue wrist braces.  Refer to neurology for further evaluation.  Last ncv/emg study was over 2 years ago.

## 2020-02-04 NOTE — Patient Instructions (Addendum)
Fill the new prescription and take nortriptyline 2 caps at bedtime.  After a few days increase to 3 caps at bedtime (this is a higher dose than what you're on now I don't think this is coming from your neck based on today's exam but we discussed consideration of imaging there. The typical next step is to see the neurologist for reevaluation. Follow up with me in 1 month otherwise

## 2020-03-08 NOTE — Progress Notes (Signed)
[facility]    Provider:  Dr Lucia Gaskins Requesting Provider: Lenda Kelp Primary Care Provider:  [facility]  CC:  Hand paim  HPI:  Travis Odom is a 54 y.o. male here as requested by [facility] for worsening bilateral pain and numbness in the hands and wrists . PHx CTS, Carpal Tunnel Release, reviewed Dr. Lazaro Arms notes: FHx of Charcot Marie Tooth, No neck pain, no radiation into the arms, taking nortriptyline for pain, has 5/5 strength except for thumb opposition, sensation diminished to light touch radial aspect of hand and thumb, +tinels, +phalens.   He is here alone. He reports he had carpal tunnel release surgery. He says he improved after the CTS release. He had it on both Dr. Melvyn Novas he improved, he had the right hand and for a month he felt great the symptoms and then he did the left hand and that felt better as well. Since then he is slowly progressing and getting worse again, he is now etting pain shooting down the right arm (point to the biceos and forearm to the thumb), he has neck pain, hands are getting numb, he can't pick up anything, night time is the worse and he is wearing splints. They go numb at night. No symptoms in the feet. He had genetic testing He has been under the care of doctors for his neck, radiation into the arms and chronic neck pain worsening. Also weakness. In the right hand he feels digit 2-4 on both hands are numb and tingling, worse at night or in the morning, worse positionally, he has weakness of grip, he also feels stiffness. He has joint pain as well.   Reviewed notes, labs and imaging from outside physicians, which showed:  Emg/ncs with moderately-severe left and mild right carpal tunnel.   Review of Systems: Patient complains of symptoms per HPI as well as the following symptoms: hand numbness. Pertinent negatives and positives per HPI. All others negative.   Social History   Socioeconomic History  .  Marital status: Single    Spouse name: Not on file  . Number of children: Not on file  . Years of education: 58  . Highest education level: Not on file  Occupational History  . Occupation: [employer]  Tobacco Use  . Smoking status: Never Smoker  . Smokeless tobacco: Never Used  Substance and Sexual Activity  . Alcohol use: Yes    Comment: occasional  . Drug use: No  . Sexual activity: Not on file  Other Topics Concern  . Not on file  Social History Narrative   Denies caffeine use    Lives alone   Right handed   Social Determinants of Health   Financial Resource Strain:   . Difficulty of Paying Living Expenses:   Food Insecurity:   . Worried About Programme researcher, broadcasting/film/video in the Last Year:   . Barista in the Last Year:   Transportation Needs:   . Freight forwarder (Medical):   Marland Kitchen Lack of Transportation (Non-Medical):   Physical Activity:   . Days of Exercise per Week:   . Minutes of Exercise per Session:   Stress:   . Feeling of Stress :   Social Connections:   . Frequency of Communication with Friends and Family:   . Frequency of Social Gatherings with Friends and Family:   . Attends Religious Services:   . Active Member of Clubs or Organizations:   . Attends Banker Meetings:   .  Marital Status:   Intimate Partner Violence:   . Fear of Current or Ex-Partner:   . Emotionally Abused:   Marland Kitchen Physically Abused:   . Sexually Abused:     Family History  Problem Relation Age of Onset  . Charcot-Marie-Tooth disease Mother   . Charcot-Marie-Tooth disease Sister   . Charcot-Marie-Tooth disease Brother   . Charcot-Marie-Tooth disease Sister     Past Medical History:  Diagnosis Date  . CTS (carpal tunnel syndrome)   . Rheumatic fever in pediatric patient     Patient Active Problem List   Diagnosis Date Noted  . Right lateral epicondylitis 11/14/2017  . Bilateral carpal tunnel syndrome 11/14/2017    Past Surgical History:  Procedure Laterality  Date  . CARPAL TUNNEL RELEASE Bilateral    R 2019, L 2020  . EYE SURGERY      Current Outpatient Medications  Medication Sig Dispense Refill  . Acetaminophen (TYLENOL 8 HOUR ARTHRITIS PAIN PO) Take by mouth.    . IBUPROFEN PO Take by mouth.    . nortriptyline (PAMELOR) 25 MG capsule Take 3 capsules (75 mg total) by mouth at bedtime. 90 capsule 1   No current facility-administered medications for this visit.    Allergies as of 03/09/2020  . (No Known Allergies)    Vitals: BP 129/86 (BP Location: Right Arm, Patient Position: Sitting)   Pulse 62   Temp (!) 97.5 F (36.4 C) Comment: taken at front  Ht 5\' 9"  (1.753 m)   Wt 203 lb (92.1 kg)   BMI 29.98 kg/m  Last Weight:  Wt Readings from Last 1 Encounters:  03/09/20 203 lb (92.1 kg)   Last Height:   Ht Readings from Last 1 Encounters:  03/09/20 5\' 9"  (1.753 m)     Neuro: Detailed Neurologic Exam  Speech:    Speech is normal; fluent and spontaneous with normal comprehension.  Cognition:    The patient is oriented to person, place, and time;     recent and remote memory intact;     language fluent;     normal attention, concentration,     fund of knowledge Cranial Nerves:    The pupils are equal, round, and reactive to light. Visual fields are full to finger confrontation. Extraocular movements are intact. Trigeminal sensation is intact and the muscles of mastication are normal. The face is symmetric. The palate elevates in the midline. Hearing intact. Voice is normal. Shoulder shrug is normal. The tongue has normal motion without fasciculations.   Coordination:    No dysmetria  Gait:    Normal native gait   Motor Observation:    No asymmetry, no atrophy, and no involuntary movements noted. Tone:    Normal muscle tone.    Posture:    Posture is normal. normal erect    Strength:    Weak grip R>L with right opponens weakness.   Sensation: decreased sensation median distribution splitting of digit 4  bilaterally     Reflex Exam:  DTR's:    Left arm reflexes are slightly brisker than the right   +phalen's maneuver, +tinel's at the wrists R>L    Assessment/Plan:  54 y.o. male here as requested by [facility] for worsening bilateral pain and numbness in the hands and wrists. He has been under the care of doctors for his neck, radiation into the arms and chronic neck pain worsening. Also weakness.  +Tinel's and Phalen's. He may have some neck involvement as the left reflexes are brisker and  he report radicular symptoms however it appears he has worsening carpal tunnel symptoms and signs, he has sensory changes with splitting of dig 4 which is median distribution and weakness grip and oppones right > left.   - Mri of the cervical spine - He is terrified of the emg/ncs, for right now let's just order an MRI of the cervical spine and see. Would prefer an emg/ncs but he is terrified maybe we can send back to [facility] for ultrasound or other imaging studies to examine the median nerve - Complicated by uninsured status, will try to get you cone financial assistance.  - He should also go to physical therapy. he does go to the gym and can work out and uses a ball to try and help his strength. I can't say if he can or cannot perform his job; his job is in a Advice worker, repetitious work, Theme park manager for school buses, he feels he can't do it because of numbness in the hands especially with repetition. He really need some occupational therapy to work with him but we need to get him insured, will try to help with cone financial assistance.    Cc: [facility],  Dr. Caralyn Guile, Karlton Lemon MD  Sarina Ill, MD  [facility] [address] [place], [place] [address]  Phone [phone] Fax [phone]  I spent 40 minutes of face-to-face and non-face-to-face time with patient on the  1. Bilateral carpal tunnel syndrome   2. Cervical  radiculopathy   3. Bilateral hand numbness   4. Hand weakness   5. Chronic neck pain    diagnosis.  This included previsit chart review, lab review, study review, order entry, electronic health record documentation, patient education on the different diagnostic and therapeutic options, counseling and coordination of care, risks and benefits of management, compliance, or risk factor reduction

## 2020-03-09 ENCOUNTER — Encounter: Payer: Self-pay | Admitting: Neurology

## 2020-03-09 ENCOUNTER — Ambulatory Visit: Payer: BLUE CROSS/BLUE SHIELD | Admitting: Neurology

## 2020-03-09 ENCOUNTER — Other Ambulatory Visit: Payer: Self-pay

## 2020-03-09 VITALS — BP 129/86 | HR 62 | Temp 97.5°F | Ht 69.0 in | Wt 203.0 lb

## 2020-03-09 DIAGNOSIS — M5412 Radiculopathy, cervical region: Secondary | ICD-10-CM

## 2020-03-09 DIAGNOSIS — G8929 Other chronic pain: Secondary | ICD-10-CM

## 2020-03-09 DIAGNOSIS — M542 Cervicalgia: Secondary | ICD-10-CM

## 2020-03-09 DIAGNOSIS — G5603 Carpal tunnel syndrome, bilateral upper limbs: Secondary | ICD-10-CM

## 2020-03-09 DIAGNOSIS — R200 Anesthesia of skin: Secondary | ICD-10-CM

## 2020-03-09 DIAGNOSIS — R29898 Other symptoms and signs involving the musculoskeletal system: Secondary | ICD-10-CM

## 2020-03-09 NOTE — Patient Instructions (Signed)
MRI of the cervical spine Would prefer an emg/ncs but he is terrified maybe we can send back to [facility] for ultrasound or other imaging studies to examine the median nerve

## 2020-03-18 ENCOUNTER — Telehealth: Payer: Self-pay | Admitting: *Deleted

## 2020-03-18 NOTE — Telephone Encounter (Signed)
We received disability request forms. Spoke with Dr. Lucia Gaskins. She cannot support disability, also, work-up hasn't been completed. Travis Odom will need to request his primary care Dr. Norton Blizzard complete these instead.   I tried to reach Travis Odom. Will try again Monday.

## 2020-03-22 ENCOUNTER — Telehealth: Payer: Self-pay | Admitting: Neurology

## 2020-03-22 NOTE — Telephone Encounter (Signed)
I have faxed form for patient 's Travis Odom Patient assistance . Telephone [phone]- fax [phone] . Number given to patient so he can follow up. Thanks Annabelle Harman

## 2020-03-23 NOTE — Telephone Encounter (Signed)
Spoke with pt & let him know Dr. Lucia Gaskins cannot support the disability request and that workup is not complete anyway. He will need to have primary care complete, speak with [facility]. Pt aware our office will refund his money. He verbalized appreciation for the call.

## 2020-03-24 ENCOUNTER — Telehealth: Payer: Self-pay | Admitting: Neurology

## 2020-03-24 NOTE — Telephone Encounter (Signed)
I called patient and LVM to reschedule NCV/EMG on 5/3 due to tech being out. Requested patient call back to reschedule.

## 2020-04-05 ENCOUNTER — Encounter: Payer: Self-pay | Admitting: Neurology

## 2020-04-08 ENCOUNTER — Ambulatory Visit (INDEPENDENT_AMBULATORY_CARE_PROVIDER_SITE_OTHER): Payer: Self-pay | Admitting: Neurology

## 2020-04-08 ENCOUNTER — Other Ambulatory Visit: Payer: Self-pay

## 2020-04-08 DIAGNOSIS — M542 Cervicalgia: Secondary | ICD-10-CM

## 2020-04-08 DIAGNOSIS — Z0289 Encounter for other administrative examinations: Secondary | ICD-10-CM

## 2020-04-08 DIAGNOSIS — G8929 Other chronic pain: Secondary | ICD-10-CM

## 2020-04-08 DIAGNOSIS — R200 Anesthesia of skin: Secondary | ICD-10-CM

## 2020-04-08 DIAGNOSIS — G5603 Carpal tunnel syndrome, bilateral upper limbs: Secondary | ICD-10-CM

## 2020-04-08 DIAGNOSIS — M5412 Radiculopathy, cervical region: Secondary | ICD-10-CM

## 2020-04-08 DIAGNOSIS — R29898 Other symptoms and signs involving the musculoskeletal system: Secondary | ICD-10-CM

## 2020-04-08 NOTE — Progress Notes (Signed)
Full Name: Travis Odom Gender: Male MRN #: [id number] Date of Birth: 1966/08/23    Visit Date: 04/08/2020 10:11 Age: 54 Years Examining Physician: Sarina Ill, MD  Referring Physician: Dene Gentry Height: 5 feet 9 inch  History: Patient with reported severe bilateral hand pain. Pending MRI cervical spine (discussed today with patient, he has been awaiting cone financial assistance; he was put in contact with our staff again to help him schedule) Summary: EMG/NCS was performed on the right upper extremity. Patient declines any further testing.   The right Median 2nd Digit orthodromic sensory nerve showed prolonged distal peak latency (3.5 ms, N<3.4). The right median/ulnar (palm) comparison nerve showed prolonged distal peak latency (Median Palm, 2.6 ms, N<2.2) and abnormal peak latency difference (Median Palm-Ulnar Palm, 0.5 ms, N<0.4) with a relative median delay.  (PRIOR results: 12/20/17: The right median motor nerve showed delayed distal onset latency (5.7 ms, N<4.4).The right orthodromic median sensory nerve showed delayed distal peak latency (4.6 ms, N<3.4) and reduced amplitude (8 V, N>10). The right median/ulnar (palm) comparison nerve showed prolonged distal peak latency (Median Palm, 3.5 ms, N<2.2) and abnormal peak latency difference (Median Palm-Ulnar Palm, 1.4 ms, N<0.4) with a relative median delay.)  All remaining nerves (as indicated in the following tables) were within normal limits.  All muscles (as indicated in the following tables) were within normal limits.       Conclusion: Significant improvement in nerve conductions after Carpal Tunnel Release Surgery. Residual minimal medial sensory conduction latency delay and mild peak latency difference on palm comparison conductions. No suggestion of polyneuropathy or cervical radiculopathy of the right upper extremity.  Sarina Ill, M.D.  [facility] [place], [place] [address] Tel:  [phone] Fax: [phone]  Verbal informed consent was obtained from the patient, patient was informed of potential risk of procedure, including bruising, bleeding, hematoma formation, infection, muscle weakness, muscle pain, numbness, among others.         [place]    Nerve / Sites Muscle Latency Ref. Amplitude Ref. Rel Amp Segments Distance Velocity Ref. Area    ms ms mV mV %  cm m/s m/s mVms  R Median - APB     Wrist APB 4.0 ?4.4 8.3 ?4.0 100 Wrist - APB 7   32.3     Upper arm APB 8.5  7.7  91.9 Upper arm - Wrist 25 55 ?49 29.8  R Ulnar - ADM     Wrist ADM 2.8 ?3.3 11.5 ?6.0 100 Wrist - ADM 7   34.4     B.Elbow ADM 6.4  10.6  92 B.Elbow - Wrist 22 61 ?49 33.6     A.Elbow ADM 8.1  10.8  102 A.Elbow - B.Elbow 10 59 ?49 35.2         A.Elbow - Wrist             SNC    Nerve / Sites Rec. Site Peak Lat Ref.  Amp Ref. Segments Distance Peak Diff Ref.    ms ms V V  cm ms ms  R Median, Ulnar - Transcarpal comparison     Median Palm Wrist 2.6 ?2.2 51 ?35 Median Palm - Wrist 8       Ulnar Palm Wrist 2.1 ?2.2 26 ?12 Ulnar Palm - Wrist 8          Median Palm - Ulnar Palm  0.5 ?0.4  R Median - Orthodromic (Dig II, Mid palm)  Dig II Wrist 3.5 ?3.4 13 ?10 Dig II - Wrist 13    R Ulnar - Orthodromic, (Dig V, Mid palm)     Dig V Wrist 2.8 ?3.1 6 ?5 Dig V - Wrist 67             F  Wave    Nerve F Lat Ref.   ms ms  R Ulnar - ADM 27.7 ?32.0       EMG Summary Table    Spontaneous MUAP Recruitment  Muscle IA Fib PSW Fasc Other Amp Dur. Poly Pattern  R. Deltoid Normal None None None _______ Normal Normal Normal Normal  R. Triceps brachii Normal None None None _______ Normal Normal Normal Normal  R. Pronator teres Normal None None None _______ Normal Normal Normal Normal  R. First dorsal interosseous Normal None None None _______ Normal Normal Normal Normal  R. Cervical paraspinals (low) Normal None None None _______ Normal Normal Normal Normal  R. Opponens pollicis Normal None None None  _______ Normal Normal Normal Normal

## 2020-04-08 NOTE — Progress Notes (Signed)
See procedure note.

## 2020-04-12 ENCOUNTER — Telehealth: Payer: Self-pay | Admitting: Neurology

## 2020-04-12 MED ORDER — ALPRAZOLAM 0.5 MG PO TABS: ORAL_TABLET | ORAL | 0 refills | Status: AC

## 2020-04-12 NOTE — Telephone Encounter (Signed)
I sent in 2 alprazolam pills to take 1 or 2 before MRI

## 2020-04-12 NOTE — Procedures (Signed)
Full Name: Travis Odom Gender: Male MRN #: [id number] Date of Birth: 01-25-1966    Visit Date: 04/08/2020 10:11 Age: 54 Years Examining Physician: Sarina Ill, MD  Referring Physician: Dene Gentry Height: 5 feet 9 inch  History: Patient with reported severe bilateral hand pain Summary: EMG/NCS was performed on the right upper extremity. Patient declines any further testing.   The right Median 2nd Digit orthodromic sensory nerve showed prolonged distal peak latency (3.5 ms, N<3.4). The right median/ulnar (palm) comparison nerve showed prolonged distal peak latency (Median Palm, 2.6 ms, N<2.2) and abnormal peak latency difference (Median Palm-Ulnar Palm, 0.5 ms, N<0.4) with a relative median delay.  (PRIOR results: 12/20/17: The right median motor nerve showed delayed distal onset latency (5.7 ms, N<4.4).The right orthodromic median sensory nerve showed delayed distal peak latency (4.6 ms, N<3.4) and reduced amplitude (8 V, N>10). The right median/ulnar (palm) comparison nerve showed prolonged distal peak latency (Median Palm, 3.5 ms, N<2.2) and abnormal peak latency difference (Median Palm-Ulnar Palm, 1.4 ms, N<0.4) with a relative median delay.)  All remaining nerves (as indicated in the following tables) were within normal limits.  All muscles (as indicated in the following tables) were within normal limits.       Conclusion: Significant improvement in nerve conductions after Carpal Tunnel Release Surgery. Residual minimal medial sensory conduction latency delay and mild peak latency difference on palm comparison conductions. No suggestion of polyneuropathy or cervical radiculopathy of the right upper extremity.  Sarina Ill, M.D.  [facility] [place], [place] [address] Tel: [phone] Fax: [phone]  Verbal informed consent was obtained from the patient, patient was informed of potential risk of procedure, including bruising, bleeding,  hematoma formation, infection, muscle weakness, muscle pain, numbness, among others.         [place]    Nerve / Sites Muscle Latency Ref. Amplitude Ref. Rel Amp Segments Distance Velocity Ref. Area    ms ms mV mV %  cm m/s m/s mVms  R Median - APB     Wrist APB 4.0 ?4.4 8.3 ?4.0 100 Wrist - APB 7   32.3     Upper arm APB 8.5  7.7  91.9 Upper arm - Wrist 25 55 ?49 29.8  R Ulnar - ADM     Wrist ADM 2.8 ?3.3 11.5 ?6.0 100 Wrist - ADM 7   34.4     B.Elbow ADM 6.4  10.6  92 B.Elbow - Wrist 22 61 ?49 33.6     A.Elbow ADM 8.1  10.8  102 A.Elbow - B.Elbow 10 59 ?49 35.2         A.Elbow - Wrist             SNC    Nerve / Sites Rec. Site Peak Lat Ref.  Amp Ref. Segments Distance Peak Diff Ref.    ms ms V V  cm ms ms  R Median, Ulnar - Transcarpal comparison     Median Palm Wrist 2.6 ?2.2 51 ?35 Median Palm - Wrist 8       Ulnar Palm Wrist 2.1 ?2.2 26 ?12 Ulnar Palm - Wrist 8          Median Palm - Ulnar Palm  0.5 ?0.4  R Median - Orthodromic (Dig II, Mid palm)     Dig II Wrist 3.5 ?3.4 13 ?10 Dig II - Wrist 13    R Ulnar - Orthodromic, (Dig V, Mid palm)  Dig V Wrist 2.8 ?3.1 6 ?5 Dig V - Wrist 34             F  Wave    Nerve F Lat Ref.   ms ms  R Ulnar - ADM 27.7 ?32.0       EMG Summary Table    Spontaneous MUAP Recruitment  Muscle IA Fib PSW Fasc Other Amp Dur. Poly Pattern  R. Deltoid Normal None None None _______ Normal Normal Normal Normal  R. Triceps brachii Normal None None None _______ Normal Normal Normal Normal  R. Pronator teres Normal None None None _______ Normal Normal Normal Normal  R. First dorsal interosseous Normal None None None _______ Normal Normal Normal Normal  R. Cervical paraspinals (low) Normal None None None _______ Normal Normal Normal Normal  R. Opponens pollicis Normal None None None _______ Normal Normal Normal Normal

## 2020-04-12 NOTE — Telephone Encounter (Signed)
no to the covid questions MR Cervical spine wo contrast Dr. Lucia Gaskins self pay. Patient is scheduled at [facility] for 04/14/20. Patient did inform me he is slightly claustrophobic and would like something. He is aware to have a driver.

## 2020-04-12 NOTE — Addendum Note (Signed)
Addended by: Despina Arias A on: 04/12/2020 12:52 PM   Modules accepted: Orders

## 2020-04-12 NOTE — Telephone Encounter (Signed)
I called pt on mobile # asking for a call back. When the patient calls back, please let him know the physician sent in two tables of Xanax 0.5 mg. He can take 1 or 2 tablets by mouth about 30 minutes before his MRI. He needs to have a driver since this medication will likely make him sleepy. If he has any questions please let us know.

## 2020-04-12 NOTE — Telephone Encounter (Signed)
Pt has returned the call to Emily, please call 

## 2020-04-12 NOTE — Telephone Encounter (Signed)
Checked [place] registry. No controlled medications filled within the last 12 months. Will send to work-in physician.

## 2020-04-13 NOTE — Telephone Encounter (Signed)
I called the pt again and LVM asking for call back. Left office number in message. When he calls back, please give him the message below from yesterday with the Xanax instructions.

## 2020-04-14 ENCOUNTER — Ambulatory Visit: Payer: Self-pay

## 2020-04-14 ENCOUNTER — Other Ambulatory Visit: Payer: Self-pay

## 2020-04-14 DIAGNOSIS — R200 Anesthesia of skin: Secondary | ICD-10-CM

## 2020-04-14 DIAGNOSIS — R29898 Other symptoms and signs involving the musculoskeletal system: Secondary | ICD-10-CM

## 2020-04-14 DIAGNOSIS — M5412 Radiculopathy, cervical region: Secondary | ICD-10-CM

## 2020-04-14 DIAGNOSIS — M542 Cervicalgia: Secondary | ICD-10-CM

## 2020-04-14 DIAGNOSIS — G8929 Other chronic pain: Secondary | ICD-10-CM

## 2020-04-19 ENCOUNTER — Telehealth: Payer: Self-pay | Admitting: Neurology

## 2020-04-19 NOTE — Telephone Encounter (Signed)
Anson Fret, MD  04/16/2020  8:27 AM EDT    MRI of the cervical spine shows no etiology for his symptoms, essentially normal. He is to return to pcp thanks

## 2020-04-19 NOTE — Telephone Encounter (Signed)
Pt called wanting to know if his MRI results are in. Please advise. 

## 2020-04-19 NOTE — Telephone Encounter (Signed)
I called the pt back to discuss results. He was not available so I LVM asking for call back. When the pt calls back, please let him know the MRI of the cervical spine shows no cause for his symptoms. It's essentially normal. He is to return to his primary care doctor.

## 2020-04-19 NOTE — Telephone Encounter (Signed)
Pt called back in regards to missed call  

## 2020-04-19 NOTE — Telephone Encounter (Signed)
I am unaware of who called the pt but I will return his call to discuss MRI results.

## 2020-04-20 NOTE — Telephone Encounter (Signed)
Pt called back and the message from 05-17 was relayed to him, no request made for a call back

## 2020-04-20 NOTE — Telephone Encounter (Signed)
I tried to call the pt again. LVM asking for call back. When he calls back, please give my message from yesterday that is in bold below.

## 2020-04-21 ENCOUNTER — Ambulatory Visit (INDEPENDENT_AMBULATORY_CARE_PROVIDER_SITE_OTHER): Payer: Self-pay | Admitting: Family Medicine

## 2020-04-21 ENCOUNTER — Encounter: Payer: Self-pay | Admitting: Family Medicine

## 2020-04-21 ENCOUNTER — Other Ambulatory Visit: Payer: Self-pay

## 2020-04-21 VITALS — BP 114/78 | Ht 69.0 in | Wt 201.0 lb

## 2020-04-21 DIAGNOSIS — M25531 Pain in right wrist: Secondary | ICD-10-CM

## 2020-04-21 DIAGNOSIS — M25532 Pain in left wrist: Secondary | ICD-10-CM

## 2020-04-21 MED ORDER — PREGABALIN 75 MG PO CAPS: 75.0000 mg | ORAL_CAPSULE | Freq: Two times a day (BID) | ORAL | 2 refills | Status: AC

## 2020-04-21 NOTE — Progress Notes (Signed)
[facility]P: [facility]  Subjective:   HPI: Patient is a 54 y.o. male here for bilateral hand/wrist pain.  2/1: Previously diagnosed with carpal tunnel syndrome on both sides since 2018, R>L. Patient tried splinting overnight, steroid injection, prolonged steroid course and NSAIDs (oral and topical) with minimal  relief. Nerve conductions were positive x2. He was referred to Dr. Melvyn Novas, neurology and underwent injections followed by a carpal tunnel releases on his right side 03/27/2018 on his left side 02/02/2019. He reports he was unable to follow up after surgery due to cost and covid. He has been told he needs imaging of his neck but he has not had this done due to cost.   See HPI and/or previous note for associated ROS.  3/3: Patient reports he continues to struggle with pain and numbness in both upper hands and wrists.   Continues to use nighttime splints, has been taking nortriptyline 30mg  qhs without much relief unfortunately. No neck pain. No radiation into upper arms.  5/19: Patient returns with continued pain, numbness in both hands. Pain worse at night especially with increased activity during the day. His numbness is primarily in ring and pinky fingers at this time. No radiation up past the wrists. No neck pain. He stopped nortriptyline as it was making him sleepy and didn't seem to be helping much. Saw neurology and had NCV/EMG right upper extremity - significant improvement compared to prior study with minimal median nerve sensory delay.  No evidence ulnar neuropathy. MRI cervical spine was normal.  Past Medical History:  Diagnosis Date  . CTS (carpal tunnel syndrome)   . Rheumatic fever in pediatric patient     Current Outpatient Medications on File Prior to Visit  Medication Sig Dispense Refill  . Acetaminophen (TYLENOL 8 HOUR ARTHRITIS PAIN PO) Take by mouth.    . ALPRAZolam (XANAX) 0.5 MG tablet Take one or two before MRI 2 tablet 0  . IBUPROFEN PO Take by  mouth.    . nortriptyline (PAMELOR) 25 MG capsule Take 3 capsules (75 mg total) by mouth at bedtime. 90 capsule 1   No current facility-administered medications on file prior to visit.    Past Surgical History:  Procedure Laterality Date  . CARPAL TUNNEL RELEASE Bilateral    R 2019, L 2020  . EYE SURGERY      No Known Allergies  Social History   Socioeconomic History  . Marital status: Single    Spouse name: Not on file  . Number of children: Not on file  . Years of education: 52  . Highest education level: Not on file  Occupational History  . Occupation: [employer]  Tobacco Use  . Smoking status: Never Smoker  . Smokeless tobacco: Never Used  Substance and Sexual Activity  . Alcohol use: Yes    Comment: occasional  . Drug use: No  . Sexual activity: Not on file  Other Topics Concern  . Not on file  Social History Narrative   Denies caffeine use    Lives alone   Right handed   Social Determinants of Health   Financial Resource Strain:   . Difficulty of Paying Living Expenses:   Food Insecurity:   . Worried About 14 in the Last Year:   . Programme researcher, broadcasting/film/video in the Last Year:   Transportation Needs:   . Barista (Medical):   Freight forwarder Lack of Transportation (Non-Medical):   Physical Activity:   . Days of Exercise per Week:   .  Minutes of Exercise per Session:   Stress:   . Feeling of Stress :   Social Connections:   . Frequency of Communication with Friends and Family:   . Frequency of Social Gatherings with Friends and Family:   . Attends Religious Services:   . Active Member of Clubs or Organizations:   . Attends Archivist Meetings:   Marland Kitchen Marital Status:   Intimate Partner Violence:   . Fear of Current or Ex-Partner:   . Emotionally Abused:   Marland Kitchen Physically Abused:   . Sexually Abused:     Family History  Problem Relation Age of Onset  . Charcot-Marie-Tooth disease Mother   . Charcot-Marie-Tooth disease Sister   .  Charcot-Marie-Tooth disease Brother   . Charcot-Marie-Tooth disease Sister     BP 114/78   Ht 5\' 9"  (1.753 m)   Wt 201 lb (91.2 kg)   BMI 29.68 kg/m   Review of Systems: See HPI above.     Objective:  Physical Exam:  Gen: NAD, comfortable in exam room  Right hand/wrist: No deformity, swelling, bruising, warmth, atrophy. FROM with 5/5 strength finger abduction, extension, thumb opposition. TTP over carpal tunnel.  No other tenderness. Sensation diminished 5th digit. Negative tinels.  Left hand/wrist: No deformity, swelling, bruising, warmth, atrophy. FROM with 5/5 strength finger abduction, extension, thumb opposition. TTP over carpal tunnel.  No other tenderness. Sensation diminished 5th digit. Negative tinels.  Negative tinels cubital tunnel.  Limited MSK u/s bilateral wrists:  No notch sign of either median nerve.  Left median nerve area 0.07cm2, right 0.11cm2   Assessment & Plan:  1. Bilateral hand/wrist pain and numbness - s/p carpal tunnel releases.  MRI cervical spine was normal.  Repeat NCV/EMGs consistent with very mild carpal tunnel on right though current symptoms more in ulnar nerve distribution where his NCV was normal for ulnar nerve.  No evidence Complex Regional Pain Syndrome.  Has tried nortriptyline but made him too sleepy.  Will trial lyrica 75mg  twice a day.  Refer to PM&R for evaluation and their input once [facility] coverage goes through.

## 2020-04-21 NOTE — Patient Instructions (Signed)
Start lyrica 75mg  twice a day - you can start just in the evenings first to see if you tolerate it. Look into both [facility] and [facility] to see if they have any programs. When you get the [facility] coverage let [facility] know and we will put in a referral to the PM&R physicians.

## 2020-05-14 ENCOUNTER — Encounter: Payer: Self-pay | Admitting: Family Medicine

## 2020-05-15 ENCOUNTER — Other Ambulatory Visit: Payer: Self-pay

## 2020-05-15 DIAGNOSIS — Y9389 Activity, other specified: Secondary | ICD-10-CM | POA: Insufficient documentation

## 2020-05-15 DIAGNOSIS — Z79899 Other long term (current) drug therapy: Secondary | ICD-10-CM | POA: Insufficient documentation

## 2020-05-15 DIAGNOSIS — R519 Headache, unspecified: Secondary | ICD-10-CM | POA: Insufficient documentation

## 2020-05-15 DIAGNOSIS — Y998 Other external cause status: Secondary | ICD-10-CM | POA: Insufficient documentation

## 2020-05-15 DIAGNOSIS — R42 Dizziness and giddiness: Secondary | ICD-10-CM | POA: Insufficient documentation

## 2020-05-15 DIAGNOSIS — Y929 Unspecified place or not applicable: Secondary | ICD-10-CM | POA: Insufficient documentation

## 2020-05-15 DIAGNOSIS — M542 Cervicalgia: Secondary | ICD-10-CM | POA: Insufficient documentation

## 2020-05-15 DIAGNOSIS — W01198A Fall on same level from slipping, tripping and stumbling with subsequent striking against other object, initial encounter: Secondary | ICD-10-CM | POA: Insufficient documentation

## 2020-05-15 NOTE — ED Triage Notes (Signed)
Pt arrives POV, reports slipping on water and falling back, hitting posterior head yesterday. Pt c/o head and neck pain, photosensitivity, and lightheaded.

## 2020-05-16 ENCOUNTER — Emergency Department (HOSPITAL_BASED_OUTPATIENT_CLINIC_OR_DEPARTMENT_OTHER)
Admission: EM | Admit: 2020-05-16 | Discharge: 2020-05-16 | Disposition: A | Discharge status: Home / Self Care | Payer: Self-pay | Attending: Emergency Medicine | Admitting: Emergency Medicine

## 2020-05-16 ENCOUNTER — Encounter (HOSPITAL_BASED_OUTPATIENT_CLINIC_OR_DEPARTMENT_OTHER): Payer: Self-pay | Admitting: Emergency Medicine

## 2020-05-16 ENCOUNTER — Emergency Department (HOSPITAL_BASED_OUTPATIENT_CLINIC_OR_DEPARTMENT_OTHER): Payer: Self-pay

## 2020-05-16 DIAGNOSIS — W19XXXA Unspecified fall, initial encounter: Secondary | ICD-10-CM

## 2020-05-16 MED ORDER — ACETAMINOPHEN 500 MG PO TABS
1000.0000 mg | ORAL_TABLET | Freq: Once | ORAL | Status: AC
  Administered 2020-05-16: 1000 mg via ORAL
  Filled 2020-05-16: qty 2

## 2020-05-16 NOTE — ED Provider Notes (Addendum)
[facility] EMERGENCY DEPARTMENT Provider Note   CSN: [id number] Arrival date & time: 05/15/20  2224     History Chief Complaint  Patient presents with  . Fall    Travis Odom is a 54 y.o. male.  The history is provided by the patient.  Fall This is a new problem. The current episode started yesterday. The problem occurs constantly. The problem has not changed since onset.Pertinent negatives include no chest pain, no abdominal pain and no shortness of breath. Nothing aggravates the symptoms. Nothing relieves the symptoms. He has tried nothing for the symptoms. The treatment provided no relief.  Fell and hit head.  No LOC. No weakness, no numbness.       Past Medical History:  Diagnosis Date  . CTS (carpal tunnel syndrome)   . Rheumatic fever in pediatric patient     Patient Active Problem List   Diagnosis Date Noted  . Right lateral epicondylitis 11/14/2017  . Bilateral carpal tunnel syndrome 11/14/2017    Past Surgical History:  Procedure Laterality Date  . CARPAL TUNNEL RELEASE Bilateral    R 2019, L 2020  . EYE SURGERY         Family History  Problem Relation Age of Onset  . Charcot-Marie-Tooth disease Mother   . Charcot-Marie-Tooth disease Sister   . Charcot-Marie-Tooth disease Brother   . Charcot-Marie-Tooth disease Sister     Social History   Tobacco Use  . Smoking status: Never Smoker  . Smokeless tobacco: Never Used  Vaping Use  . Vaping Use: Never used  Substance Use Topics  . Alcohol use: Yes    Comment: occasional  . Drug use: No    Home Medications Prior to Admission medications   Medication Sig Start Date End Date Taking? Authorizing Provider  Acetaminophen (TYLENOL 8 HOUR ARTHRITIS PAIN PO) Take by mouth.    [provider]  ALPRAZolam Prudy Feeler) 0.5 MG tablet Take one or two before MRI 04/12/20   Sater, Pearletha Furl, MD  IBUPROFEN PO Take by mouth.    [provider]  nortriptyline (PAMELOR) 25 MG capsule Take  3 capsules (75 mg total) by mouth at bedtime. 02/04/20   Hudnall, Azucena Fallen, MD  pregabalin (LYRICA) 75 MG capsule Take 1 capsule (75 mg total) by mouth 2 (two) times daily. 04/21/20   Lenda Kelp, MD    Allergies    Patient has no known allergies.  Review of Systems   Review of Systems  Constitutional: Negative for fever.  HENT: Negative for congestion.   Eyes: Negative for visual disturbance.  Respiratory: Negative for shortness of breath.   Cardiovascular: Negative for chest pain.  Gastrointestinal: Negative for abdominal pain.  Genitourinary: Negative for difficulty urinating.  Musculoskeletal: Negative for arthralgias.  Skin: Negative for wound.  Neurological: Negative for facial asymmetry.  Psychiatric/Behavioral: Negative for agitation.  All other systems reviewed and are negative.   Physical Exam Updated Vital Signs BP 128/85   Pulse 63   Temp 98.4 F (36.9 C) (Oral)   Resp 14   Ht 5\' 9"  (1.753 m)   Wt 93 kg   SpO2 99%   BMI 30.27 kg/m   Physical Exam Vitals and nursing note reviewed.  Constitutional:      General: He is not in acute distress.    Appearance: Normal appearance.  HENT:     Head: Normocephalic and atraumatic.     Right Ear: Tympanic membrane normal.     Left Ear: Tympanic membrane normal.  Nose: Nose normal.  Eyes:     Conjunctiva/sclera: Conjunctivae normal.     Pupils: Pupils are equal, round, and reactive to light.  Cardiovascular:     Rate and Rhythm: Normal rate and regular rhythm.     Pulses: Normal pulses.     Heart sounds: Normal heart sounds.  Pulmonary:     Effort: Pulmonary effort is normal.     Breath sounds: Normal breath sounds.  Abdominal:     General: Abdomen is flat. Bowel sounds are normal.     Tenderness: There is no abdominal tenderness. There is no guarding or rebound.  Musculoskeletal:        General: Normal range of motion.     Cervical back: Normal range of motion and neck supple.  Skin:    General: Skin  is warm and dry.     Capillary Refill: Capillary refill takes less than 2 seconds.  Neurological:     General: No focal deficit present.     Mental Status: He is alert and oriented to person, place, and time.  Psychiatric:        Mood and Affect: Mood normal.        Behavior: Behavior normal.     ED Results / Procedures / Treatments   Labs (all labs ordered are listed, but only abnormal results are displayed) Labs Reviewed - No data to display  EKG None  Radiology CT Head Wo Contrast  Result Date: 05/16/2020 CLINICAL DATA:  Fall, hit back of head EXAM: CT HEAD WITHOUT CONTRAST TECHNIQUE: Contiguous axial images were obtained from the base of the skull through the vertex without intravenous contrast. COMPARISON:  08/21/2016 FINDINGS: Brain: No acute intracranial abnormality. Specifically, no hemorrhage, hydrocephalus, mass lesion, acute infarction, or significant intracranial injury. Vascular: No hyperdense vessel or unexpected calcification. Skull: No acute calvarial abnormality. Sinuses/Orbits: Mucosal thickening throughout the ethmoid air cells. No air-fluid levels. Other: None IMPRESSION: No intracranial abnormality. Chronic sinusitis. Electronically Signed   By: Rolm Baptise M.D.   On: 05/16/2020 00:32   CT Cervical Spine Wo Contrast  Result Date: 05/16/2020 CLINICAL DATA:  Fall, hit back of head EXAM: CT CERVICAL SPINE WITHOUT CONTRAST TECHNIQUE: Multidetector CT imaging of the cervical spine was performed without intravenous contrast. Multiplanar CT image reconstructions were also generated. COMPARISON:  MRI 04/14/2020 FINDINGS: Alignment: Slight anterolisthesis of C7 on T1 related to degenerative facet disease. Skull base and vertebrae: No acute fracture. No primary bone lesion or focal pathologic process. Soft tissues and spinal canal: No prevertebral fluid or swelling. No visible canal hematoma. Disc levels:  Diffuse degenerative disc disease and facet disease. Upper chest: No  acute findings Other: None IMPRESSION: Degenerative disc and facet disease.  No acute bony abnormality. Electronically Signed   By: Rolm Baptise M.D.   On: 05/16/2020 00:34    Procedures Procedures (including critical care time)  Medications Ordered in ED Medications  acetaminophen (TYLENOL) tablet 1,000 mg (1,000 mg Oral Given 05/16/20 0112)    ED Course  I have reviewed the triage vital signs and the nursing notes.  Pertinent labs & imaging results that were available during my care of the patient were reviewed by me and considered in my medical decision making (see chart for details).    Brain and spine are normal on CT scan.  Alternate tylenol and ibuprofen for pain and limit screen time for 7 days.  Drink copious liquids.  Patient is well appearing and stable for discharge with close follow up.  Baley Lorimer was evaluated in Emergency Department on 05/16/2020 for the symptoms described in the history of present illness. He was evaluated in the context of the global COVID-19 pandemic, which necessitated consideration that the patient might be at risk for infection with the SARS-CoV-2 virus that causes COVID-19. Institutional protocols and algorithms that pertain to the evaluation of patients at risk for COVID-19 are in a state of rapid change based on information released by regulatory bodies including the CDC and federal and state organizations. These policies and algorithms were followed during the patient's care in the ED.  Final Clinical Impression(s) / ED Diagnoses Final diagnoses:  Fall, initial encounter    Return for intractable cough, coughing up blood,fevers >100.4 unrelieved by medication, shortness of breath, intractable vomiting, chest pain, shortness of breath, weakness,numbness, changes in speech, facial asymmetry,abdominal pain, passing out,Inability to tolerate liquids or food, cough, altered mental status or any concerns. No signs of systemic illness or  infection. The patient is nontoxic-appearing on exam and vital signs are within normal limits.   I have reviewed the triage vital signs and the nursing notes. Pertinent labs &imaging results that were available during my care of the patient were reviewed by me and considered in my medical decision making (see chart for details).After history, exam, and medical workup I feel the patient has beenappropriately medically screened and is safe for discharge home. Pertinent diagnoses were discussed with the patient. Patient was given return precautions.   Rylyn Zawistowski, MD 05/16/20 [id number]    Cy Blamer, MD 05/16/20 [id number]

## 2020-07-13 ENCOUNTER — Other Ambulatory Visit: Payer: Self-pay

## 2020-07-13 ENCOUNTER — Ambulatory Visit (INDEPENDENT_AMBULATORY_CARE_PROVIDER_SITE_OTHER): Payer: Self-pay | Admitting: Family Medicine

## 2020-07-13 VITALS — BP 124/84 | Ht 69.0 in | Wt 205.0 lb

## 2020-07-13 DIAGNOSIS — M25531 Pain in right wrist: Secondary | ICD-10-CM

## 2020-07-13 DIAGNOSIS — M25532 Pain in left wrist: Secondary | ICD-10-CM

## 2020-07-13 NOTE — Patient Instructions (Signed)
We will look into the [facility] Coverage paperwork as there are documents in your chart but not the approval letter to send you to the Physical Medicine physicians. Continue the lyrica. I'd recommend you see the hand surgeon to discuss long term outcomes, if there's anything additional that can be done or if you have impairment long term.

## 2020-07-14 ENCOUNTER — Telehealth: Payer: Self-pay

## 2020-07-14 ENCOUNTER — Encounter: Payer: Self-pay | Admitting: Family Medicine

## 2020-07-14 NOTE — Telephone Encounter (Signed)
When pt calls back will relay the following message:  He should have received a letter stating that his application for the financial assistance was missing some paperwork. He failed to provide that information so his application was denied. He should have been notified of this by a letter mailed to his home address. He can re-apply again 6 months after the denial, which would be in November.

## 2020-07-14 NOTE — Progress Notes (Signed)
[facility]P: [facility]  Subjective:   HPI: Patient is a 54 y.o. male here for bilateral hand/wrist pain.  2/1: Previously diagnosed with carpal tunnel syndrome on both sides since 2018, R>L. Patient tried splinting overnight, steroid injection, prolonged steroid course and NSAIDs (oral and topical) with minimal  relief. Nerve conductions were positive x2. He was referred to Dr. Melvyn Novas, neurology and underwent injections followed by a carpal tunnel releases on his right side 03/27/2018 on his left side 02/02/2019. He reports he was unable to follow up after surgery due to cost and covid. He has been told he needs imaging of his neck but he has not had this done due to cost.   See HPI and/or previous note for associated ROS.  3/3: Patient reports he continues to struggle with pain and numbness in both upper hands and wrists.   Continues to use nighttime splints, has been taking nortriptyline 30mg  qhs without much relief unfortunately. No neck pain. No radiation into upper arms.  5/19: Patient returns with continued pain, numbness in both hands. Pain worse at night especially with increased activity during the day. His numbness is primarily in ring and pinky fingers at this time. No radiation up past the wrists. No neck pain. He stopped nortriptyline as it was making him sleepy and didn't seem to be helping much. Saw neurology and had NCV/EMG right upper extremity - significant improvement compared to prior study with minimal median nerve sensory delay.  No evidence ulnar neuropathy. MRI cervical spine was normal.  8/10: Patient returns today with continued symptoms. Notes numbness in ring and pinky fingers on right, thumb through middle digits on left. Difficulty holding, gripping items. Worse at night and with repetitive activity. Turned in paperwork for [facility] Coverage but unsure on status - did not receive letter in the mail or an orange card.  Past Medical History:  Diagnosis  Date  . CTS (carpal tunnel syndrome)   . Rheumatic fever in pediatric patient     Current Outpatient Medications on File Prior to Visit  Medication Sig Dispense Refill  . Acetaminophen (TYLENOL 8 HOUR ARTHRITIS PAIN PO) Take by mouth.    . ALPRAZolam (XANAX) 0.5 MG tablet Take one or two before MRI 2 tablet 0  . IBUPROFEN PO Take by mouth.    . nortriptyline (PAMELOR) 25 MG capsule Take 3 capsules (75 mg total) by mouth at bedtime. 90 capsule 1  . pregabalin (LYRICA) 75 MG capsule Take 1 capsule (75 mg total) by mouth 2 (two) times daily. 60 capsule 2   No current facility-administered medications on file prior to visit.    Past Surgical History:  Procedure Laterality Date  . CARPAL TUNNEL RELEASE Bilateral    R 2019, L 2020  . EYE SURGERY      No Known Allergies  Social History   Socioeconomic History  . Marital status: Single    Spouse name: Not on file  . Number of children: Not on file  . Years of education: 14  . Highest education level: Not on file  Occupational History  . Occupation: [employer]  Tobacco Use  . Smoking status: Never Smoker  . Smokeless tobacco: Never Used  Vaping Use  . Vaping Use: Never used  Substance and Sexual Activity  . Alcohol use: Yes    Comment: occasional  . Drug use: No  . Sexual activity: Not on file  Other Topics Concern  . Not on file  Social History Narrative   Denies caffeine  use    Lives alone   Right handed   Social Determinants of Health   Financial Resource Strain:   . Difficulty of Paying Living Expenses:   Food Insecurity:   . Worried About Programme researcher, broadcasting/film/video in the Last Year:   . Barista in the Last Year:   Transportation Needs:   . Freight forwarder (Medical):   Marland Kitchen Lack of Transportation (Non-Medical):   Physical Activity:   . Days of Exercise per Week:   . Minutes of Exercise per Session:   Stress:   . Feeling of Stress :   Social Connections:   . Frequency of Communication with Friends and  Family:   . Frequency of Social Gatherings with Friends and Family:   . Attends Religious Services:   . Active Member of Clubs or Organizations:   . Attends Banker Meetings:   Marland Kitchen Marital Status:   Intimate Partner Violence:   . Fear of Current or Ex-Partner:   . Emotionally Abused:   Marland Kitchen Physically Abused:   . Sexually Abused:     Family History  Problem Relation Age of Onset  . Charcot-Marie-Tooth disease Mother   . Charcot-Marie-Tooth disease Sister   . Charcot-Marie-Tooth disease Brother   . Charcot-Marie-Tooth disease Sister     BP 124/84   Ht 5\' 9"  (1.753 m)   Wt 205 lb (93 kg)   BMI 30.27 kg/m   Review of Systems: See HPI above.     Objective:  Physical Exam:  Gen: NAD, comfortable in exam room  Right hand/wrist: No deformity. FROM with 5/5 strength except 5-/5 thumb opposition. No tenderness to palpation. Sensation diminished 5th digit.  Left hand/wrist: No deformity. FROM with 5/5 strength except 5-/5 thumb opposition. No tenderness to palpation. Sensation diminished thumb, index finger.  Assessment & Plan:  1. Bilateral hand/wrist pain and numbness - s/p carpal tunnel releases.  Normal cervical spine MRI.  Repeat NCV/EMGs consistent with very mild carpal tunnel on right with much improvement following surgery and current symptoms on this side more in ulnar nerve distribution.  No evidence Complex Regional Pain Syndrome.  Tolerating lyrica - will continue this.  Will look into status of his [facility] Coverage before eval by PM&R.  He was also encouraged to follow back up with his hand surgeon.

## 2020-09-13 DIAGNOSIS — Z0271 Encounter for disability determination: Secondary | ICD-10-CM

## 2022-07-23 ENCOUNTER — Other Ambulatory Visit: Payer: Self-pay

## 2022-07-23 ENCOUNTER — Encounter (HOSPITAL_BASED_OUTPATIENT_CLINIC_OR_DEPARTMENT_OTHER): Payer: Self-pay | Admitting: *Deleted

## 2022-07-23 ENCOUNTER — Emergency Department (HOSPITAL_BASED_OUTPATIENT_CLINIC_OR_DEPARTMENT_OTHER): Payer: Self-pay

## 2022-07-23 ENCOUNTER — Emergency Department (HOSPITAL_BASED_OUTPATIENT_CLINIC_OR_DEPARTMENT_OTHER)
Admission: EM | Admit: 2022-07-23 | Discharge: 2022-07-23 | Disposition: A | Discharge status: Home / Self Care | Payer: Self-pay | Attending: Emergency Medicine | Admitting: Emergency Medicine

## 2022-07-23 DIAGNOSIS — R0789 Other chest pain: Secondary | ICD-10-CM | POA: Insufficient documentation

## 2022-07-23 LAB — CBC
HCT: 29.8 % — ABNORMAL LOW (ref 39.0–52.0)
Hemoglobin: 10.1 g/dL — ABNORMAL LOW (ref 13.0–17.0)
MCH: 28.8 pg (ref 26.0–34.0)
MCHC: 33.9 g/dL (ref 30.0–36.0)
MCV: 84.9 fL (ref 80.0–100.0)
Platelets: 465 10*3/uL — ABNORMAL HIGH (ref 150–400)
RBC: 3.51 MIL/uL — ABNORMAL LOW (ref 4.22–5.81)
RDW: 12.8 % (ref 11.5–15.5)
WBC: 10.8 10*3/uL — ABNORMAL HIGH (ref 4.0–10.5)
nRBC: 0 % (ref 0.0–0.2)

## 2022-07-23 LAB — TROPONIN I (HIGH SENSITIVITY): Troponin I (High Sensitivity): 3 ng/L (ref <18)

## 2022-07-23 LAB — BASIC METABOLIC PANEL
Anion gap: 9 (ref 5–15)
BUN: 19 mg/dL (ref 6–20)
CO2: 26 mmol/L (ref 22–32)
Calcium: 9.4 mg/dL (ref 8.9–10.3)
Chloride: 105 mmol/L (ref 98–111)
Creatinine, Ser: 1.38 mg/dL — ABNORMAL HIGH (ref 0.61–1.24)
GFR, Estimated: >60 mL/min (ref >60)
Glucose, Bld: 110 mg/dL — ABNORMAL HIGH (ref 70–99)
Potassium: 4.3 mmol/L (ref 3.5–5.1)
Sodium: 140 mmol/L (ref 135–145)

## 2022-07-23 LAB — D-DIMER, QUANTITATIVE: D-Dimer, Quant: 7.35 ug/mL-FEU — ABNORMAL HIGH (ref 0.00–0.50)

## 2022-07-23 MED ORDER — IOHEXOL 350 MG/ML SOLN
75.0000 mL | Freq: Once | INTRAVENOUS | Status: AC | PRN
Start: 2022-07-23 — End: 2022-07-23
  Administered 2022-07-23: 75 mL via INTRAVENOUS

## 2022-07-23 NOTE — ED Triage Notes (Signed)
Pt here for CP which began about 2 hours ago.  Pt went to [facility] regional ED but left due to wait after he had EKG, no blood work was done.  Pt describes CP as sharp and increased with breathing.  Pt reports that it is a 8/10. 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  Pt describes intermittent sob with this.

## 2022-07-23 NOTE — ED Provider Notes (Signed)
[facility] EMERGENCY DEPARTMENT Provider Note   CSN: [id number] Arrival date & time: 07/23/22  2137     History  Chief Complaint  Patient presents with   Chest Pain    Travis Odom is a 56 y.o. male.  Patient here with left-sided chest pain.  Occurred about 2 hours ago.  Pain has improved.  Somewhat worse with movement and to palpation.  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  He is on aspirin and gabapentin and narcotic pain medicine.  He was doing some rehab by walking around the block with his cane that he uses on his left arm.  When he got back he was feeling some discomfort in the left side of his chest.  Maybe short of breath but pain was worse when he took a deep breath or moves his arm.  Has no history of hypertension, high cholesterol, diabetes, no smoking history.  No cardiac history in the family.  Nothing has made it better except for some rest and keeping his arm still.  The history is provided by the patient.       Home Medications Prior to Admission medications   Medication Sig Start Date End Date Taking? Authorizing Provider  Acetaminophen (TYLENOL 8 HOUR ARTHRITIS PAIN PO) Take by mouth.    [provider]  ALPRAZolam Prudy Feeler) 0.5 MG tablet Take one or two before MRI 04/12/20   Sater, Pearletha Furl, MD  IBUPROFEN PO Take by mouth.    [provider]  nortriptyline (PAMELOR) 25 MG capsule Take 3 capsules (75 mg total) by mouth at bedtime. 02/04/20   Hudnall, Azucena Fallen, MD  pregabalin (LYRICA) 75 MG capsule Take 1 capsule (75 mg total) by mouth 2 (two) times daily. 04/21/20   Lenda Kelp, MD      Allergies    Patient has no known allergies.    Review of Systems   Review of Systems  Physical Exam Updated Vital Signs BP 127/68   Pulse 75   Temp 98.6 F (37 C) (Oral)   Resp 18   Ht 5\' 10"  (1.778 m)   Wt 88.5 kg   SpO2 99%   BMI 27.98 kg/m  Physical Exam Vitals and nursing note reviewed.  Constitutional:      General: He is not  in acute distress.    Appearance: He is well-developed. He is not ill-appearing.  HENT:     Head: Normocephalic and atraumatic.  Eyes:     Extraocular Movements: Extraocular movements intact.     Conjunctiva/sclera: Conjunctivae normal.     Pupils: Pupils are equal, round, and reactive to light.  Cardiovascular:     Rate and Rhythm: Normal rate and regular rhythm.     Pulses:          Radial pulses are 2+ on the right side and 2+ on the left side.     Heart sounds: Normal heart sounds. No murmur heard. Pulmonary:     Effort: Pulmonary effort is normal. No respiratory distress.     Breath sounds: Normal breath sounds.  Chest:     Chest wall: Tenderness present.  Abdominal:     Palpations: Abdomen is soft.     Tenderness: There is no abdominal tenderness.  Musculoskeletal:        General: No swelling. Normal range of motion.     Cervical back: Normal range of motion and neck supple.     Right lower leg: No edema.     Left lower  leg: No edema.  Skin:    General: Skin is warm and dry.     Capillary Refill: Capillary refill takes less than 2 seconds.  Neurological:     Mental Status: He is alert.  Psychiatric:        Mood and Affect: Mood normal.     ED Results / Procedures / Treatments   Labs (all labs ordered are listed, but only abnormal results are displayed) Labs Reviewed  BASIC METABOLIC PANEL - Abnormal; Notable for the following components:      Result Value   Glucose, Bld 110 (*)    Creatinine, Ser 1.38 (*)    All other components within normal limits  CBC - Abnormal; Notable for the following components:   WBC 10.8 (*)    RBC 3.51 (*)    Hemoglobin 10.1 (*)    HCT 29.8 (*)    Platelets 465 (*)    All other components within normal limits  D-DIMER, QUANTITATIVE - Abnormal; Notable for the following components:   D-Dimer, Quant 7.35 (*)    All other components within normal limits  TROPONIN I (HIGH SENSITIVITY)    EKG EKG  Interpretation  Date/Time:  Sunday July 23 2022 21:44:03 EDT Ventricular Rate:  74 PR Interval:  134 QRS Duration: 78 QT Interval:  372 QTC Calculation: 412 R Axis:   -17 Text Interpretation: Sinus rhythm with marked sinus arrhythmia No previous ECGs available Confirmed by Virgina Norfolk ([id number]) on 07/23/2022 9:45:07 PM  Radiology CT Angio Chest PE W and/or Wo Contrast  Result Date: 07/23/2022 CLINICAL DATA:  Chest pain, positive D-dimer EXAM: CT ANGIOGRAPHY CHEST WITH CONTRAST TECHNIQUE: Multidetector CT imaging of the chest was performed using the standard protocol during bolus administration of intravenous contrast. Multiplanar CT image reconstructions and MIPs were obtained to evaluate the vascular anatomy. RADIATION DOSE REDUCTION: This exam was performed according to the departmental dose-optimization program which includes automated exposure control, adjustment of the mA and/or kV according to patient size and/or use of iterative reconstruction technique. CONTRAST:  53mL OMNIPAQUE IOHEXOL 350 MG/ML SOLN COMPARISON:  Chest radiograph done earlier today FINDINGS: Cardiovascular: There is homogeneous enhancement in thoracic aorta. There are no intraluminal filling defects in pulmonary artery branches. Mediastinum/Nodes: No significant lymphadenopathy seen. There is slightly inhomogeneous attenuation in thyroid. Lungs/Pleura: There is no focal pulmonary consolidation. There is no pleural effusion or pneumothorax. Upper Abdomen: There is fatty infiltration of the liver. Musculoskeletal: No acute findings are seen. Review of the MIP images confirms the above findings. IMPRESSION: There is no evidence of pulmonary artery embolism. There is no evidence of thoracic aortic dissection. No focal pulmonary infiltrates are seen. Fatty liver. Electronically Signed   By: Ernie Avena M.D.   On: 07/23/2022 22:42   DG Chest Portable 1 View  Result Date: 07/23/2022 CLINICAL DATA:  Chest pain. EXAM:  PORTABLE CHEST 1 VIEW COMPARISON:  05/07/2017. FINDINGS: The heart size and mediastinal contours are within normal limits. Both lungs are clear. No acute osseous abnormality. IMPRESSION: No active disease. Electronically Signed   By: Thornell Sartorius M.D.   On: 07/23/2022 22:04    Procedures Procedures    Medications Ordered in ED Medications  iohexol (OMNIPAQUE) 350 MG/ML injection 75 mL (75 mLs Intravenous Contrast Given 07/23/22 2230)    ED Course/ Medical Decision Making/ A&P                           Medical Decision Making Amount  and/or Complexity of Data Reviewed Labs: ordered. Radiology: ordered.  Risk Prescription drug management.   Travis Odom is here with chest pain.  Normal vitals.  No fever.  EKG per my review and interpretation shows sinus rhythm.  No ischemic changes.  No significant medical history.  Had right hip replacement a couple weeks ago.  He is on aspirin, gabapentin, narcotics for that.  He was doing some rehab/walk around the block with his cane in his left arm trying to get his leg stronger again.  He was putting a lot of pressure on the left upper arm with his cane.  He felt like his chest started hurting and having spasm, pain with deep breaths and movement.  He has reproducible pain on exam.  This occurred about 2 hours ago.  He feels better with rest.  He has no cardiac risk factors.  His heart score is 1.  Given that he had a recent surgery we will get a D-dimer.  Differential diagnosis is likely musculoskeletal process but will evaluate for PE, ACS, pneumonia, pneumothorax with CBC, BMP, chest x-ray, D-dimer, troponin.  Already has a reassuring EKG.  Per my review and interpretation of labs is no significant anemia, electrolyte abnormality, kidney injury.  Troponin is normal at 3.  D-dimer is elevated at 7.  CT scan was obtained that showed no evidence of PE.  No pneumonia, no pneumothorax.  Overall suspect muscular process.  Recommend rest and hydration and  continue pain management.  Discharged in good condition.  This chart was dictated using voice recognition software.  Despite best efforts to proofread,  errors can occur which can change the documentation meaning.         Final Clinical Impression(s) / ED Diagnoses Final diagnoses:  Atypical chest pain    Rx / DC Orders ED Discharge Orders     None         Virgina Norfolk, DO 07/23/22 2246

## 2022-07-23 NOTE — ED Notes (Signed)
Just returned from CT.

## 2022-07-23 NOTE — ED Notes (Signed)
Patient denies pain and is resting comfortably.
